# Patient Record
Sex: Female | Born: 1945
Health system: Southern US, Community
[De-identification: ages and names within clinical notes are randomized; demographics above are authoritative.]

## PROBLEM LIST (undated history)

## (undated) DIAGNOSIS — I639 Cerebral infarction, unspecified: Secondary | ICD-10-CM

## (undated) DIAGNOSIS — M069 Rheumatoid arthritis, unspecified: Secondary | ICD-10-CM

## (undated) DIAGNOSIS — I10 Essential (primary) hypertension: Secondary | ICD-10-CM

## (undated) HISTORY — DX: Essential (primary) hypertension: I10

## (undated) HISTORY — DX: Cerebral infarction, unspecified: I63.9

## (undated) HISTORY — PX: ESOPHAGOGASTRODUODENOSCOPY: SHX1529

## (undated) HISTORY — PX: COLONOSCOPY: SHX174

## (undated) HISTORY — DX: Rheumatoid arthritis, unspecified: M06.9

---

## 2002-05-09 ENCOUNTER — Emergency Department (HOSPITAL_COMMUNITY): Admission: EM | Admit: 2002-05-09 | Discharge: 2002-05-09 | Payer: Self-pay | Admitting: Emergency Medicine

## 2002-10-30 ENCOUNTER — Encounter: Admission: RE | Admit: 2002-10-30 | Discharge: 2002-10-30 | Payer: Self-pay | Admitting: Family Medicine

## 2002-10-30 ENCOUNTER — Encounter: Payer: Self-pay | Admitting: Family Medicine

## 2003-11-08 ENCOUNTER — Encounter: Admission: RE | Admit: 2003-11-08 | Discharge: 2003-11-08 | Payer: Self-pay | Admitting: Family Medicine

## 2003-11-13 ENCOUNTER — Other Ambulatory Visit: Admission: RE | Admit: 2003-11-13 | Discharge: 2003-11-13 | Payer: Self-pay | Admitting: Family Medicine

## 2004-11-10 ENCOUNTER — Encounter: Admission: RE | Admit: 2004-11-10 | Discharge: 2004-11-10 | Payer: Self-pay | Admitting: Family Medicine

## 2004-12-02 ENCOUNTER — Other Ambulatory Visit: Admission: RE | Admit: 2004-12-02 | Discharge: 2004-12-02 | Payer: Self-pay | Admitting: Family Medicine

## 2005-11-16 ENCOUNTER — Encounter: Admission: RE | Admit: 2005-11-16 | Discharge: 2005-11-16 | Payer: Self-pay | Admitting: Obstetrics and Gynecology

## 2006-11-22 ENCOUNTER — Encounter: Admission: RE | Admit: 2006-11-22 | Discharge: 2006-11-22 | Payer: Self-pay | Admitting: Family Medicine

## 2007-11-23 ENCOUNTER — Encounter: Admission: RE | Admit: 2007-11-23 | Discharge: 2007-11-23 | Payer: Self-pay | Admitting: Family Medicine

## 2007-12-06 ENCOUNTER — Other Ambulatory Visit: Admission: RE | Admit: 2007-12-06 | Discharge: 2007-12-06 | Payer: Self-pay | Admitting: Family Medicine

## 2008-06-14 ENCOUNTER — Encounter: Admission: RE | Admit: 2008-06-14 | Discharge: 2008-06-14 | Payer: Self-pay | Admitting: Gastroenterology

## 2008-06-22 ENCOUNTER — Encounter: Admission: RE | Admit: 2008-06-22 | Discharge: 2008-06-22 | Payer: Self-pay | Admitting: Gastroenterology

## 2008-11-26 ENCOUNTER — Encounter: Admission: RE | Admit: 2008-11-26 | Discharge: 2008-11-26 | Payer: Self-pay | Admitting: Family Medicine

## 2009-04-27 ENCOUNTER — Inpatient Hospital Stay (HOSPITAL_COMMUNITY): Admission: EM | Admit: 2009-04-27 | Discharge: 2009-04-30 | Payer: Self-pay | Admitting: Emergency Medicine

## 2009-04-28 ENCOUNTER — Encounter (INDEPENDENT_AMBULATORY_CARE_PROVIDER_SITE_OTHER): Payer: Self-pay | Admitting: Internal Medicine

## 2009-04-29 ENCOUNTER — Encounter (INDEPENDENT_AMBULATORY_CARE_PROVIDER_SITE_OTHER): Payer: Self-pay | Admitting: Internal Medicine

## 2009-12-03 ENCOUNTER — Encounter: Admission: RE | Admit: 2009-12-03 | Discharge: 2009-12-03 | Payer: Self-pay | Admitting: Family Medicine

## 2010-10-16 LAB — URINALYSIS, ROUTINE W REFLEX MICROSCOPIC
Bilirubin Urine: NEGATIVE
Glucose, UA: NEGATIVE mg/dL
Hgb urine dipstick: NEGATIVE
Ketones, ur: NEGATIVE mg/dL
Nitrite: NEGATIVE
Protein, ur: NEGATIVE mg/dL
Specific Gravity, Urine: 1.011 (ref 1.005–1.030)
Urobilinogen, UA: 0.2 mg/dL (ref 0.0–1.0)
pH: 5.5 (ref 5.0–8.0)

## 2010-10-16 LAB — UIFE/LIGHT CHAINS/TP QN, 24-HR UR
Albumin, U: DETECTED
Alpha 1, Urine: DETECTED — AB
Alpha 2, Urine: DETECTED — AB
Beta, Urine: DETECTED — AB
Free Kappa Lt Chains,Ur: 0.64 mg/dL (ref 0.04–1.51)
Free Kappa/Lambda Ratio: 7.11 ratio — ABNORMAL HIGH (ref 0.46–4.00)
Free Lambda Excretion/Day: 1.19 mg/d
Free Lambda Lt Chains,Ur: 0.09 mg/dL (ref 0.08–1.01)
Free Lt Chn Excr Rate: 8.48 mg/d
Gamma Globulin, Urine: DETECTED — AB
Time: 24 hours
Total Protein, Urine-Ur/day: 16 mg/d (ref 10–140)
Total Protein, Urine: 1.2 mg/dL
Volume, Urine: 1325 mL

## 2010-10-16 LAB — SEDIMENTATION RATE: Sed Rate: 17 mm/hr (ref 0–22)

## 2010-10-16 LAB — DIFFERENTIAL
Basophils Absolute: 0 10*3/uL (ref 0.0–0.1)
Basophils Relative: 0 % (ref 0–1)
Eosinophils Absolute: 0.1 10*3/uL (ref 0.0–0.7)
Eosinophils Relative: 1 % (ref 0–5)
Lymphocytes Relative: 22 % (ref 12–46)
Lymphs Abs: 2.1 10*3/uL (ref 0.7–4.0)
Monocytes Absolute: 0.5 10*3/uL (ref 0.1–1.0)
Monocytes Relative: 5 % (ref 3–12)
Neutro Abs: 6.7 10*3/uL (ref 1.7–7.7)
Neutrophils Relative %: 71 % (ref 43–77)

## 2010-10-16 LAB — CARDIAC PANEL(CRET KIN+CKTOT+MB+TROPI)
CK, MB: 1.2 ng/mL (ref 0.3–4.0)
CK, MB: 1.2 ng/mL (ref 0.3–4.0)
Relative Index: 1.2 (ref 0.0–2.5)
Relative Index: INVALID (ref 0.0–2.5)
Total CK: 104 U/L (ref 7–177)
Total CK: 98 U/L (ref 7–177)
Troponin I: 0.01 ng/mL (ref 0.00–0.06)
Troponin I: 0.02 ng/mL (ref 0.00–0.06)

## 2010-10-16 LAB — HOMOCYSTEINE
Homocysteine: 10 umol/L (ref 4.0–15.4)
Homocysteine: 8 umol/L (ref 4.0–15.4)

## 2010-10-16 LAB — COMPREHENSIVE METABOLIC PANEL
ALT: 18 U/L (ref 0–35)
ALT: 18 U/L (ref 0–35)
AST: 19 U/L (ref 0–37)
AST: 22 U/L (ref 0–37)
Albumin: 3.7 g/dL (ref 3.5–5.2)
Albumin: 3.9 g/dL (ref 3.5–5.2)
Alkaline Phosphatase: 101 U/L (ref 39–117)
Alkaline Phosphatase: 108 U/L (ref 39–117)
BUN: 14 mg/dL (ref 6–23)
BUN: 15 mg/dL (ref 6–23)
CO2: 26 mEq/L (ref 19–32)
CO2: 29 mEq/L (ref 19–32)
Calcium: 9.3 mg/dL (ref 8.4–10.5)
Calcium: 9.3 mg/dL (ref 8.4–10.5)
Chloride: 105 mEq/L (ref 96–112)
Chloride: 106 mEq/L (ref 96–112)
Creatinine, Ser: 0.86 mg/dL (ref 0.4–1.2)
Creatinine, Ser: 0.89 mg/dL (ref 0.4–1.2)
GFR calc Af Amer: >60 mL/min (ref >60)
GFR calc Af Amer: >60 mL/min (ref >60)
GFR calc non Af Amer: >60 mL/min (ref >60)
GFR calc non Af Amer: >60 mL/min (ref >60)
Glucose, Bld: 112 mg/dL — ABNORMAL HIGH (ref 70–99)
Glucose, Bld: 131 mg/dL — ABNORMAL HIGH (ref 70–99)
Potassium: 3.4 mEq/L — ABNORMAL LOW (ref 3.5–5.1)
Potassium: 3.8 mEq/L (ref 3.5–5.1)
Sodium: 140 mEq/L (ref 135–145)
Sodium: 141 mEq/L (ref 135–145)
Total Bilirubin: 0.6 mg/dL (ref 0.3–1.2)
Total Bilirubin: 0.6 mg/dL (ref 0.3–1.2)
Total Protein: 7 g/dL (ref 6.0–8.3)
Total Protein: 7.5 g/dL (ref 6.0–8.3)

## 2010-10-16 LAB — BASIC METABOLIC PANEL
BUN: 14 mg/dL (ref 6–23)
BUN: 15 mg/dL (ref 6–23)
CO2: 28 mEq/L (ref 19–32)
CO2: 28 mEq/L (ref 19–32)
Calcium: 9 mg/dL (ref 8.4–10.5)
Calcium: 9.3 mg/dL (ref 8.4–10.5)
Chloride: 102 mEq/L (ref 96–112)
Chloride: 107 mEq/L (ref 96–112)
Creatinine, Ser: 0.89 mg/dL (ref 0.4–1.2)
Creatinine, Ser: 0.92 mg/dL (ref 0.4–1.2)
GFR calc Af Amer: >60 mL/min (ref >60)
GFR calc Af Amer: >60 mL/min (ref >60)
GFR calc non Af Amer: >60 mL/min (ref >60)
GFR calc non Af Amer: >60 mL/min (ref >60)
Glucose, Bld: 111 mg/dL — ABNORMAL HIGH (ref 70–99)
Glucose, Bld: 92 mg/dL (ref 70–99)
Potassium: 3.6 mEq/L (ref 3.5–5.1)
Potassium: 3.7 mEq/L (ref 3.5–5.1)
Sodium: 136 mEq/L (ref 135–145)
Sodium: 141 mEq/L (ref 135–145)

## 2010-10-16 LAB — LIPID PANEL
Cholesterol: 170 mg/dL (ref 0–200)
HDL: 53 mg/dL (ref >39)
LDL Cholesterol: 89 mg/dL (ref 0–99)
Total CHOL/HDL Ratio: 3.2 RATIO
Triglycerides: 139 mg/dL (ref <150)
VLDL: 28 mg/dL (ref 0–40)

## 2010-10-16 LAB — URINE CULTURE
Colony Count: NO GROWTH
Culture: NO GROWTH

## 2010-10-16 LAB — CBC
HCT: 33.7 % — ABNORMAL LOW (ref 36.0–46.0)
HCT: 34.4 % — ABNORMAL LOW (ref 36.0–46.0)
HCT: 37.3 % (ref 36.0–46.0)
Hemoglobin: 11.6 g/dL — ABNORMAL LOW (ref 12.0–15.0)
Hemoglobin: 11.9 g/dL — ABNORMAL LOW (ref 12.0–15.0)
Hemoglobin: 12.6 g/dL (ref 12.0–15.0)
MCHC: 33.9 g/dL (ref 30.0–36.0)
MCHC: 34.4 g/dL (ref 30.0–36.0)
MCHC: 34.5 g/dL (ref 30.0–36.0)
MCV: 89.1 fL (ref 78.0–100.0)
MCV: 90 fL (ref 78.0–100.0)
MCV: 90 fL (ref 78.0–100.0)
Platelets: 223 10*3/uL (ref 150–400)
Platelets: 227 10*3/uL (ref 150–400)
Platelets: 272 10*3/uL (ref 150–400)
RBC: 3.74 MIL/uL — ABNORMAL LOW (ref 3.87–5.11)
RBC: 3.82 MIL/uL — ABNORMAL LOW (ref 3.87–5.11)
RBC: 4.18 MIL/uL (ref 3.87–5.11)
RDW: 13.7 % (ref 11.5–15.5)
RDW: 13.8 % (ref 11.5–15.5)
RDW: 13.8 % (ref 11.5–15.5)
WBC: 6.2 10*3/uL (ref 4.0–10.5)
WBC: 7.1 10*3/uL (ref 4.0–10.5)
WBC: 9.4 10*3/uL (ref 4.0–10.5)

## 2010-10-16 LAB — APTT: aPTT: 32 seconds (ref 24–37)

## 2010-10-16 LAB — PROTIME-INR
INR: 1.14 (ref 0.00–1.49)
Prothrombin Time: 14.5 seconds (ref 11.6–15.2)

## 2010-10-16 LAB — CK TOTAL AND CKMB (NOT AT ARMC)
CK, MB: 1.5 ng/mL (ref 0.3–4.0)
Relative Index: 1.3 (ref 0.0–2.5)
Total CK: 118 U/L (ref 7–177)

## 2010-10-16 LAB — HEMOGLOBIN A1C
Hgb A1c MFr Bld: 5.6 % (ref 4.6–6.1)
Mean Plasma Glucose: 114 mg/dL

## 2010-10-16 LAB — TSH: TSH: 1.154 u[IU]/mL (ref 0.350–4.500)

## 2010-10-16 LAB — VITAMIN B12: Vitamin B-12: 1151 pg/mL — ABNORMAL HIGH (ref 211–911)

## 2010-10-16 LAB — PROTEIN ELECTROPH W RFLX QUANT IMMUNOGLOBULINS
Albumin ELP: 54.7 % — ABNORMAL LOW (ref 55.8–66.1)
Alpha-1-Globulin: 4 % (ref 2.9–4.9)
Alpha-2-Globulin: 9.2 % (ref 7.1–11.8)
Beta 2: 6.4 % (ref 3.2–6.5)
Beta Globulin: 5.6 % (ref 4.7–7.2)
Gamma Globulin: 20.1 % — ABNORMAL HIGH (ref 11.1–18.8)
M-Spike, %: NOT DETECTED g/dL
Total Protein ELP: 7.3 g/dL (ref 6.0–8.3)

## 2010-10-16 LAB — FOLATE: Folate: >20 ng/mL

## 2010-10-16 LAB — RPR: RPR Ser Ql: NONREACTIVE

## 2010-10-16 LAB — ANA: Anti Nuclear Antibody(ANA): NEGATIVE

## 2010-10-16 LAB — TROPONIN I: Troponin I: 0.01 ng/mL (ref 0.00–0.06)

## 2010-10-30 ENCOUNTER — Other Ambulatory Visit (HOSPITAL_COMMUNITY)
Admission: RE | Admit: 2010-10-30 | Discharge: 2010-10-30 | Disposition: A | Discharge status: Home / Self Care | Payer: Federal, State, Local not specified - PPO | Source: Ambulatory Visit | Attending: Family Medicine | Admitting: Family Medicine

## 2010-10-30 ENCOUNTER — Other Ambulatory Visit: Payer: Self-pay | Admitting: Family Medicine

## 2010-10-30 DIAGNOSIS — Z124 Encounter for screening for malignant neoplasm of cervix: Secondary | ICD-10-CM | POA: Insufficient documentation

## 2010-11-20 ENCOUNTER — Other Ambulatory Visit: Payer: Self-pay | Admitting: Family Medicine

## 2010-11-20 DIAGNOSIS — Z1231 Encounter for screening mammogram for malignant neoplasm of breast: Secondary | ICD-10-CM

## 2010-12-23 ENCOUNTER — Ambulatory Visit
Admission: RE | Admit: 2010-12-23 | Discharge: 2010-12-23 | Disposition: A | Discharge status: Home / Self Care | Payer: Federal, State, Local not specified - PPO | Source: Ambulatory Visit | Attending: Family Medicine | Admitting: Family Medicine

## 2010-12-23 DIAGNOSIS — Z1231 Encounter for screening mammogram for malignant neoplasm of breast: Secondary | ICD-10-CM

## 2011-02-19 ENCOUNTER — Other Ambulatory Visit: Payer: Self-pay | Admitting: Rheumatology

## 2011-02-19 DIAGNOSIS — M069 Rheumatoid arthritis, unspecified: Secondary | ICD-10-CM

## 2011-02-26 ENCOUNTER — Ambulatory Visit
Admission: RE | Admit: 2011-02-26 | Discharge: 2011-02-26 | Disposition: A | Discharge status: Home / Self Care | Payer: Federal, State, Local not specified - PPO | Source: Ambulatory Visit | Attending: Rheumatology | Admitting: Rheumatology

## 2011-02-26 DIAGNOSIS — M069 Rheumatoid arthritis, unspecified: Secondary | ICD-10-CM

## 2011-11-03 ENCOUNTER — Other Ambulatory Visit (HOSPITAL_COMMUNITY)
Admission: RE | Admit: 2011-11-03 | Discharge: 2011-11-03 | Disposition: A | Discharge status: Home / Self Care | Payer: Federal, State, Local not specified - PPO | Source: Ambulatory Visit | Attending: Family Medicine | Admitting: Family Medicine

## 2011-11-03 ENCOUNTER — Other Ambulatory Visit: Payer: Self-pay | Admitting: Family Medicine

## 2011-11-03 DIAGNOSIS — Z124 Encounter for screening for malignant neoplasm of cervix: Secondary | ICD-10-CM | POA: Insufficient documentation

## 2011-11-23 ENCOUNTER — Other Ambulatory Visit: Payer: Self-pay | Admitting: Family Medicine

## 2011-11-23 DIAGNOSIS — Z1231 Encounter for screening mammogram for malignant neoplasm of breast: Secondary | ICD-10-CM

## 2012-02-02 ENCOUNTER — Ambulatory Visit
Admission: RE | Admit: 2012-02-02 | Discharge: 2012-02-02 | Disposition: A | Discharge status: Home / Self Care | Payer: Federal, State, Local not specified - PPO | Source: Ambulatory Visit | Attending: Family Medicine | Admitting: Family Medicine

## 2012-02-02 DIAGNOSIS — M2550 Pain in unspecified joint: Secondary | ICD-10-CM | POA: Diagnosis not present

## 2012-02-02 DIAGNOSIS — M069 Rheumatoid arthritis, unspecified: Secondary | ICD-10-CM | POA: Diagnosis not present

## 2012-02-02 DIAGNOSIS — Z1231 Encounter for screening mammogram for malignant neoplasm of breast: Secondary | ICD-10-CM | POA: Diagnosis not present

## 2012-02-02 DIAGNOSIS — Z79899 Other long term (current) drug therapy: Secondary | ICD-10-CM | POA: Diagnosis not present

## 2012-05-03 DIAGNOSIS — Z23 Encounter for immunization: Secondary | ICD-10-CM | POA: Diagnosis not present

## 2012-05-03 DIAGNOSIS — M2550 Pain in unspecified joint: Secondary | ICD-10-CM | POA: Diagnosis not present

## 2012-05-03 DIAGNOSIS — M069 Rheumatoid arthritis, unspecified: Secondary | ICD-10-CM | POA: Diagnosis not present

## 2012-05-03 DIAGNOSIS — Z79899 Other long term (current) drug therapy: Secondary | ICD-10-CM | POA: Diagnosis not present

## 2012-08-02 DIAGNOSIS — M2550 Pain in unspecified joint: Secondary | ICD-10-CM | POA: Diagnosis not present

## 2012-08-02 DIAGNOSIS — Z79899 Other long term (current) drug therapy: Secondary | ICD-10-CM | POA: Diagnosis not present

## 2012-08-02 DIAGNOSIS — M069 Rheumatoid arthritis, unspecified: Secondary | ICD-10-CM | POA: Diagnosis not present

## 2012-11-01 DIAGNOSIS — M2550 Pain in unspecified joint: Secondary | ICD-10-CM | POA: Diagnosis not present

## 2012-11-01 DIAGNOSIS — Z79899 Other long term (current) drug therapy: Secondary | ICD-10-CM | POA: Diagnosis not present

## 2012-11-01 DIAGNOSIS — M069 Rheumatoid arthritis, unspecified: Secondary | ICD-10-CM | POA: Diagnosis not present

## 2012-11-08 DIAGNOSIS — Z0000 Encounter for general adult medical examination without abnormal findings: Secondary | ICD-10-CM | POA: Diagnosis not present

## 2012-11-08 DIAGNOSIS — I679 Cerebrovascular disease, unspecified: Secondary | ICD-10-CM | POA: Diagnosis not present

## 2012-11-08 DIAGNOSIS — Z23 Encounter for immunization: Secondary | ICD-10-CM | POA: Diagnosis not present

## 2012-11-08 DIAGNOSIS — I10 Essential (primary) hypertension: Secondary | ICD-10-CM | POA: Diagnosis not present

## 2012-12-26 ENCOUNTER — Other Ambulatory Visit: Payer: Self-pay

## 2012-12-26 DIAGNOSIS — Z1231 Encounter for screening mammogram for malignant neoplasm of breast: Secondary | ICD-10-CM

## 2013-02-02 ENCOUNTER — Ambulatory Visit
Admission: RE | Admit: 2013-02-02 | Discharge: 2013-02-02 | Disposition: A | Discharge status: Home / Self Care | Payer: Medicare Other | Source: Ambulatory Visit

## 2013-02-02 DIAGNOSIS — Z1231 Encounter for screening mammogram for malignant neoplasm of breast: Secondary | ICD-10-CM | POA: Diagnosis not present

## 2013-02-07 DIAGNOSIS — M2550 Pain in unspecified joint: Secondary | ICD-10-CM | POA: Diagnosis not present

## 2013-02-07 DIAGNOSIS — M069 Rheumatoid arthritis, unspecified: Secondary | ICD-10-CM | POA: Diagnosis not present

## 2013-02-07 DIAGNOSIS — Z79899 Other long term (current) drug therapy: Secondary | ICD-10-CM | POA: Diagnosis not present

## 2013-03-02 DIAGNOSIS — J329 Chronic sinusitis, unspecified: Secondary | ICD-10-CM | POA: Diagnosis not present

## 2013-04-28 DIAGNOSIS — Z23 Encounter for immunization: Secondary | ICD-10-CM | POA: Diagnosis not present

## 2013-05-11 DIAGNOSIS — Z79899 Other long term (current) drug therapy: Secondary | ICD-10-CM | POA: Diagnosis not present

## 2013-05-11 DIAGNOSIS — M2550 Pain in unspecified joint: Secondary | ICD-10-CM | POA: Diagnosis not present

## 2013-05-11 DIAGNOSIS — R5381 Other malaise: Secondary | ICD-10-CM | POA: Diagnosis not present

## 2013-05-11 DIAGNOSIS — M069 Rheumatoid arthritis, unspecified: Secondary | ICD-10-CM | POA: Diagnosis not present

## 2013-08-10 DIAGNOSIS — M069 Rheumatoid arthritis, unspecified: Secondary | ICD-10-CM | POA: Diagnosis not present

## 2013-08-10 DIAGNOSIS — M2550 Pain in unspecified joint: Secondary | ICD-10-CM | POA: Diagnosis not present

## 2013-08-10 DIAGNOSIS — Z79899 Other long term (current) drug therapy: Secondary | ICD-10-CM | POA: Diagnosis not present

## 2013-08-10 DIAGNOSIS — R5381 Other malaise: Secondary | ICD-10-CM | POA: Diagnosis not present

## 2013-08-10 DIAGNOSIS — R5383 Other fatigue: Secondary | ICD-10-CM | POA: Diagnosis not present

## 2013-08-21 DIAGNOSIS — B009 Herpesviral infection, unspecified: Secondary | ICD-10-CM | POA: Diagnosis not present

## 2013-08-21 DIAGNOSIS — R059 Cough, unspecified: Secondary | ICD-10-CM | POA: Diagnosis not present

## 2013-08-21 DIAGNOSIS — R05 Cough: Secondary | ICD-10-CM | POA: Diagnosis not present

## 2013-09-26 DIAGNOSIS — M069 Rheumatoid arthritis, unspecified: Secondary | ICD-10-CM | POA: Diagnosis not present

## 2013-10-10 DIAGNOSIS — M069 Rheumatoid arthritis, unspecified: Secondary | ICD-10-CM | POA: Diagnosis not present

## 2013-11-07 DIAGNOSIS — M069 Rheumatoid arthritis, unspecified: Secondary | ICD-10-CM | POA: Diagnosis not present

## 2013-11-14 DIAGNOSIS — Z13820 Encounter for screening for osteoporosis: Secondary | ICD-10-CM | POA: Diagnosis not present

## 2013-11-14 DIAGNOSIS — B009 Herpesviral infection, unspecified: Secondary | ICD-10-CM | POA: Diagnosis not present

## 2013-11-14 DIAGNOSIS — Z79899 Other long term (current) drug therapy: Secondary | ICD-10-CM | POA: Diagnosis not present

## 2013-11-14 DIAGNOSIS — Z0000 Encounter for general adult medical examination without abnormal findings: Secondary | ICD-10-CM | POA: Diagnosis not present

## 2013-11-14 DIAGNOSIS — M2550 Pain in unspecified joint: Secondary | ICD-10-CM | POA: Diagnosis not present

## 2013-11-14 DIAGNOSIS — Z23 Encounter for immunization: Secondary | ICD-10-CM | POA: Diagnosis not present

## 2013-11-14 DIAGNOSIS — R5383 Other fatigue: Secondary | ICD-10-CM | POA: Diagnosis not present

## 2013-11-14 DIAGNOSIS — I10 Essential (primary) hypertension: Secondary | ICD-10-CM | POA: Diagnosis not present

## 2013-11-14 DIAGNOSIS — J309 Allergic rhinitis, unspecified: Secondary | ICD-10-CM | POA: Diagnosis not present

## 2013-11-14 DIAGNOSIS — R5381 Other malaise: Secondary | ICD-10-CM | POA: Diagnosis not present

## 2013-11-14 DIAGNOSIS — M069 Rheumatoid arthritis, unspecified: Secondary | ICD-10-CM | POA: Diagnosis not present

## 2013-11-23 DIAGNOSIS — Z86010 Personal history of colonic polyps: Secondary | ICD-10-CM | POA: Diagnosis not present

## 2013-11-23 DIAGNOSIS — Z09 Encounter for follow-up examination after completed treatment for conditions other than malignant neoplasm: Secondary | ICD-10-CM | POA: Diagnosis not present

## 2013-11-23 DIAGNOSIS — K648 Other hemorrhoids: Secondary | ICD-10-CM | POA: Diagnosis not present

## 2013-12-25 ENCOUNTER — Other Ambulatory Visit: Payer: Self-pay

## 2013-12-25 DIAGNOSIS — Z1231 Encounter for screening mammogram for malignant neoplasm of breast: Secondary | ICD-10-CM

## 2013-12-26 DIAGNOSIS — H2510 Age-related nuclear cataract, unspecified eye: Secondary | ICD-10-CM | POA: Diagnosis not present

## 2013-12-26 DIAGNOSIS — H524 Presbyopia: Secondary | ICD-10-CM | POA: Diagnosis not present

## 2013-12-26 DIAGNOSIS — H52229 Regular astigmatism, unspecified eye: Secondary | ICD-10-CM | POA: Diagnosis not present

## 2013-12-26 DIAGNOSIS — H5210 Myopia, unspecified eye: Secondary | ICD-10-CM | POA: Diagnosis not present

## 2014-01-02 DIAGNOSIS — M069 Rheumatoid arthritis, unspecified: Secondary | ICD-10-CM | POA: Diagnosis not present

## 2014-02-06 ENCOUNTER — Ambulatory Visit
Admission: RE | Admit: 2014-02-06 | Discharge: 2014-02-06 | Disposition: A | Discharge status: Home / Self Care | Payer: Medicare Other | Source: Ambulatory Visit

## 2014-02-06 DIAGNOSIS — Z1231 Encounter for screening mammogram for malignant neoplasm of breast: Secondary | ICD-10-CM | POA: Diagnosis not present

## 2014-03-01 DIAGNOSIS — M069 Rheumatoid arthritis, unspecified: Secondary | ICD-10-CM | POA: Diagnosis not present

## 2014-03-22 DIAGNOSIS — M2550 Pain in unspecified joint: Secondary | ICD-10-CM | POA: Diagnosis not present

## 2014-03-22 DIAGNOSIS — Z79899 Other long term (current) drug therapy: Secondary | ICD-10-CM | POA: Diagnosis not present

## 2014-03-22 DIAGNOSIS — M069 Rheumatoid arthritis, unspecified: Secondary | ICD-10-CM | POA: Diagnosis not present

## 2014-04-23 ENCOUNTER — Encounter: Payer: Self-pay | Admitting: *Deleted

## 2014-04-24 DIAGNOSIS — Z23 Encounter for immunization: Secondary | ICD-10-CM | POA: Diagnosis not present

## 2014-04-24 DIAGNOSIS — M0589 Other rheumatoid arthritis with rheumatoid factor of multiple sites: Secondary | ICD-10-CM | POA: Diagnosis not present

## 2014-06-19 DIAGNOSIS — M0589 Other rheumatoid arthritis with rheumatoid factor of multiple sites: Secondary | ICD-10-CM | POA: Diagnosis not present

## 2014-08-14 DIAGNOSIS — M0589 Other rheumatoid arthritis with rheumatoid factor of multiple sites: Secondary | ICD-10-CM | POA: Diagnosis not present

## 2014-08-28 DIAGNOSIS — Z79899 Other long term (current) drug therapy: Secondary | ICD-10-CM | POA: Diagnosis not present

## 2014-08-28 DIAGNOSIS — M2550 Pain in unspecified joint: Secondary | ICD-10-CM | POA: Diagnosis not present

## 2014-08-28 DIAGNOSIS — M0589 Other rheumatoid arthritis with rheumatoid factor of multiple sites: Secondary | ICD-10-CM | POA: Diagnosis not present

## 2014-10-02 DIAGNOSIS — M0589 Other rheumatoid arthritis with rheumatoid factor of multiple sites: Secondary | ICD-10-CM | POA: Diagnosis not present

## 2014-11-20 DIAGNOSIS — J309 Allergic rhinitis, unspecified: Secondary | ICD-10-CM | POA: Diagnosis not present

## 2014-11-20 DIAGNOSIS — I679 Cerebrovascular disease, unspecified: Secondary | ICD-10-CM | POA: Diagnosis not present

## 2014-11-20 DIAGNOSIS — Z0001 Encounter for general adult medical examination with abnormal findings: Secondary | ICD-10-CM | POA: Diagnosis not present

## 2014-11-20 DIAGNOSIS — K219 Gastro-esophageal reflux disease without esophagitis: Secondary | ICD-10-CM | POA: Diagnosis not present

## 2014-11-20 DIAGNOSIS — B009 Herpesviral infection, unspecified: Secondary | ICD-10-CM | POA: Diagnosis not present

## 2014-11-20 DIAGNOSIS — M0589 Other rheumatoid arthritis with rheumatoid factor of multiple sites: Secondary | ICD-10-CM | POA: Diagnosis not present

## 2014-11-20 DIAGNOSIS — I10 Essential (primary) hypertension: Secondary | ICD-10-CM | POA: Diagnosis not present

## 2014-11-27 DIAGNOSIS — M0589 Other rheumatoid arthritis with rheumatoid factor of multiple sites: Secondary | ICD-10-CM | POA: Diagnosis not present

## 2014-12-20 DIAGNOSIS — M2550 Pain in unspecified joint: Secondary | ICD-10-CM | POA: Diagnosis not present

## 2014-12-20 DIAGNOSIS — Z79899 Other long term (current) drug therapy: Secondary | ICD-10-CM | POA: Diagnosis not present

## 2014-12-20 DIAGNOSIS — M0589 Other rheumatoid arthritis with rheumatoid factor of multiple sites: Secondary | ICD-10-CM | POA: Diagnosis not present

## 2014-12-31 ENCOUNTER — Other Ambulatory Visit: Payer: Self-pay

## 2014-12-31 DIAGNOSIS — Z1231 Encounter for screening mammogram for malignant neoplasm of breast: Secondary | ICD-10-CM

## 2015-01-22 DIAGNOSIS — M0589 Other rheumatoid arthritis with rheumatoid factor of multiple sites: Secondary | ICD-10-CM | POA: Diagnosis not present

## 2015-02-08 ENCOUNTER — Ambulatory Visit
Admission: RE | Admit: 2015-02-08 | Discharge: 2015-02-08 | Disposition: A | Discharge status: Home / Self Care | Payer: Medicare Other | Source: Ambulatory Visit

## 2015-02-08 DIAGNOSIS — Z1231 Encounter for screening mammogram for malignant neoplasm of breast: Secondary | ICD-10-CM

## 2015-03-19 DIAGNOSIS — M0589 Other rheumatoid arthritis with rheumatoid factor of multiple sites: Secondary | ICD-10-CM | POA: Diagnosis not present

## 2015-03-26 DIAGNOSIS — M0589 Other rheumatoid arthritis with rheumatoid factor of multiple sites: Secondary | ICD-10-CM | POA: Diagnosis not present

## 2015-03-26 DIAGNOSIS — Z79899 Other long term (current) drug therapy: Secondary | ICD-10-CM | POA: Diagnosis not present

## 2015-03-26 DIAGNOSIS — M2550 Pain in unspecified joint: Secondary | ICD-10-CM | POA: Diagnosis not present

## 2015-04-16 DIAGNOSIS — M0589 Other rheumatoid arthritis with rheumatoid factor of multiple sites: Secondary | ICD-10-CM | POA: Diagnosis not present

## 2015-04-26 DIAGNOSIS — Z23 Encounter for immunization: Secondary | ICD-10-CM | POA: Diagnosis not present

## 2015-05-14 DIAGNOSIS — M0589 Other rheumatoid arthritis with rheumatoid factor of multiple sites: Secondary | ICD-10-CM | POA: Diagnosis not present

## 2015-07-01 DIAGNOSIS — Z79899 Other long term (current) drug therapy: Secondary | ICD-10-CM | POA: Diagnosis not present

## 2015-07-01 DIAGNOSIS — M0589 Other rheumatoid arthritis with rheumatoid factor of multiple sites: Secondary | ICD-10-CM | POA: Diagnosis not present

## 2015-07-01 DIAGNOSIS — M2550 Pain in unspecified joint: Secondary | ICD-10-CM | POA: Diagnosis not present

## 2015-07-09 DIAGNOSIS — Z79899 Other long term (current) drug therapy: Secondary | ICD-10-CM | POA: Diagnosis not present

## 2015-07-09 DIAGNOSIS — M0589 Other rheumatoid arthritis with rheumatoid factor of multiple sites: Secondary | ICD-10-CM | POA: Diagnosis not present

## 2015-09-03 DIAGNOSIS — M0589 Other rheumatoid arthritis with rheumatoid factor of multiple sites: Secondary | ICD-10-CM | POA: Diagnosis not present

## 2015-09-03 DIAGNOSIS — Z79899 Other long term (current) drug therapy: Secondary | ICD-10-CM | POA: Diagnosis not present

## 2015-09-26 DIAGNOSIS — H52223 Regular astigmatism, bilateral: Secondary | ICD-10-CM | POA: Diagnosis not present

## 2015-09-26 DIAGNOSIS — H25092 Other age-related incipient cataract, left eye: Secondary | ICD-10-CM | POA: Diagnosis not present

## 2015-09-26 DIAGNOSIS — H5213 Myopia, bilateral: Secondary | ICD-10-CM | POA: Diagnosis not present

## 2015-09-26 DIAGNOSIS — H524 Presbyopia: Secondary | ICD-10-CM | POA: Diagnosis not present

## 2015-09-27 DIAGNOSIS — M2550 Pain in unspecified joint: Secondary | ICD-10-CM | POA: Diagnosis not present

## 2015-09-27 DIAGNOSIS — M0589 Other rheumatoid arthritis with rheumatoid factor of multiple sites: Secondary | ICD-10-CM | POA: Diagnosis not present

## 2015-09-27 DIAGNOSIS — R5383 Other fatigue: Secondary | ICD-10-CM | POA: Diagnosis not present

## 2015-09-27 DIAGNOSIS — Z79899 Other long term (current) drug therapy: Secondary | ICD-10-CM | POA: Diagnosis not present

## 2015-10-29 DIAGNOSIS — M0589 Other rheumatoid arthritis with rheumatoid factor of multiple sites: Secondary | ICD-10-CM | POA: Diagnosis not present

## 2015-12-11 DIAGNOSIS — I679 Cerebrovascular disease, unspecified: Secondary | ICD-10-CM | POA: Diagnosis not present

## 2015-12-11 DIAGNOSIS — M0589 Other rheumatoid arthritis with rheumatoid factor of multiple sites: Secondary | ICD-10-CM | POA: Diagnosis not present

## 2015-12-11 DIAGNOSIS — I10 Essential (primary) hypertension: Secondary | ICD-10-CM | POA: Diagnosis not present

## 2015-12-11 DIAGNOSIS — R634 Abnormal weight loss: Secondary | ICD-10-CM | POA: Diagnosis not present

## 2015-12-11 DIAGNOSIS — Z0000 Encounter for general adult medical examination without abnormal findings: Secondary | ICD-10-CM | POA: Diagnosis not present

## 2015-12-11 DIAGNOSIS — B009 Herpesviral infection, unspecified: Secondary | ICD-10-CM | POA: Diagnosis not present

## 2015-12-11 DIAGNOSIS — K219 Gastro-esophageal reflux disease without esophagitis: Secondary | ICD-10-CM | POA: Diagnosis not present

## 2015-12-24 DIAGNOSIS — K219 Gastro-esophageal reflux disease without esophagitis: Secondary | ICD-10-CM | POA: Diagnosis not present

## 2015-12-24 DIAGNOSIS — Z0000 Encounter for general adult medical examination without abnormal findings: Secondary | ICD-10-CM | POA: Diagnosis not present

## 2015-12-24 DIAGNOSIS — I679 Cerebrovascular disease, unspecified: Secondary | ICD-10-CM | POA: Diagnosis not present

## 2015-12-24 DIAGNOSIS — Z79899 Other long term (current) drug therapy: Secondary | ICD-10-CM | POA: Diagnosis not present

## 2015-12-24 DIAGNOSIS — I10 Essential (primary) hypertension: Secondary | ICD-10-CM | POA: Diagnosis not present

## 2015-12-24 DIAGNOSIS — B009 Herpesviral infection, unspecified: Secondary | ICD-10-CM | POA: Diagnosis not present

## 2015-12-24 DIAGNOSIS — R634 Abnormal weight loss: Secondary | ICD-10-CM | POA: Diagnosis not present

## 2015-12-24 DIAGNOSIS — M0589 Other rheumatoid arthritis with rheumatoid factor of multiple sites: Secondary | ICD-10-CM | POA: Diagnosis not present

## 2015-12-27 DIAGNOSIS — Z79899 Other long term (current) drug therapy: Secondary | ICD-10-CM | POA: Diagnosis not present

## 2015-12-27 DIAGNOSIS — M2550 Pain in unspecified joint: Secondary | ICD-10-CM | POA: Diagnosis not present

## 2015-12-27 DIAGNOSIS — M0589 Other rheumatoid arthritis with rheumatoid factor of multiple sites: Secondary | ICD-10-CM | POA: Diagnosis not present

## 2016-01-21 ENCOUNTER — Other Ambulatory Visit: Payer: Self-pay | Admitting: Family Medicine

## 2016-01-21 DIAGNOSIS — Z1231 Encounter for screening mammogram for malignant neoplasm of breast: Secondary | ICD-10-CM

## 2016-02-18 DIAGNOSIS — Z79899 Other long term (current) drug therapy: Secondary | ICD-10-CM | POA: Diagnosis not present

## 2016-02-18 DIAGNOSIS — M0589 Other rheumatoid arthritis with rheumatoid factor of multiple sites: Secondary | ICD-10-CM | POA: Diagnosis not present

## 2016-02-25 ENCOUNTER — Ambulatory Visit
Admission: RE | Admit: 2016-02-25 | Discharge: 2016-02-25 | Disposition: A | Discharge status: Home / Self Care | Payer: Medicare Other | Source: Ambulatory Visit | Attending: Family Medicine | Admitting: Family Medicine

## 2016-02-25 DIAGNOSIS — Z1231 Encounter for screening mammogram for malignant neoplasm of breast: Secondary | ICD-10-CM

## 2016-03-13 DIAGNOSIS — L700 Acne vulgaris: Secondary | ICD-10-CM | POA: Diagnosis not present

## 2016-03-13 DIAGNOSIS — L508 Other urticaria: Secondary | ICD-10-CM | POA: Diagnosis not present

## 2016-04-14 DIAGNOSIS — Z79899 Other long term (current) drug therapy: Secondary | ICD-10-CM | POA: Diagnosis not present

## 2016-04-14 DIAGNOSIS — M0589 Other rheumatoid arthritis with rheumatoid factor of multiple sites: Secondary | ICD-10-CM | POA: Diagnosis not present

## 2016-04-28 DIAGNOSIS — Z79899 Other long term (current) drug therapy: Secondary | ICD-10-CM | POA: Diagnosis not present

## 2016-04-28 DIAGNOSIS — M2550 Pain in unspecified joint: Secondary | ICD-10-CM | POA: Diagnosis not present

## 2016-04-28 DIAGNOSIS — M0589 Other rheumatoid arthritis with rheumatoid factor of multiple sites: Secondary | ICD-10-CM | POA: Diagnosis not present

## 2016-05-25 DIAGNOSIS — J209 Acute bronchitis, unspecified: Secondary | ICD-10-CM | POA: Diagnosis not present

## 2016-06-09 DIAGNOSIS — Z79899 Other long term (current) drug therapy: Secondary | ICD-10-CM | POA: Diagnosis not present

## 2016-06-09 DIAGNOSIS — H578 Other specified disorders of eye and adnexa: Secondary | ICD-10-CM | POA: Diagnosis not present

## 2016-06-09 DIAGNOSIS — R51 Headache: Secondary | ICD-10-CM | POA: Diagnosis not present

## 2016-06-09 DIAGNOSIS — M0589 Other rheumatoid arthritis with rheumatoid factor of multiple sites: Secondary | ICD-10-CM | POA: Diagnosis not present

## 2016-06-11 DIAGNOSIS — G500 Trigeminal neuralgia: Secondary | ICD-10-CM | POA: Diagnosis not present

## 2016-08-04 DIAGNOSIS — M0589 Other rheumatoid arthritis with rheumatoid factor of multiple sites: Secondary | ICD-10-CM | POA: Diagnosis not present

## 2016-08-04 DIAGNOSIS — Z79899 Other long term (current) drug therapy: Secondary | ICD-10-CM | POA: Diagnosis not present

## 2016-09-29 DIAGNOSIS — M0589 Other rheumatoid arthritis with rheumatoid factor of multiple sites: Secondary | ICD-10-CM | POA: Diagnosis not present

## 2016-09-29 DIAGNOSIS — Z79899 Other long term (current) drug therapy: Secondary | ICD-10-CM | POA: Diagnosis not present

## 2016-10-27 DIAGNOSIS — Z6827 Body mass index (BMI) 27.0-27.9, adult: Secondary | ICD-10-CM | POA: Diagnosis not present

## 2016-10-27 DIAGNOSIS — Z79899 Other long term (current) drug therapy: Secondary | ICD-10-CM | POA: Diagnosis not present

## 2016-10-27 DIAGNOSIS — M2550 Pain in unspecified joint: Secondary | ICD-10-CM | POA: Diagnosis not present

## 2016-10-27 DIAGNOSIS — M0589 Other rheumatoid arthritis with rheumatoid factor of multiple sites: Secondary | ICD-10-CM | POA: Diagnosis not present

## 2016-10-27 DIAGNOSIS — E663 Overweight: Secondary | ICD-10-CM | POA: Diagnosis not present

## 2016-11-19 DIAGNOSIS — H2513 Age-related nuclear cataract, bilateral: Secondary | ICD-10-CM | POA: Diagnosis not present

## 2016-11-24 DIAGNOSIS — Z79899 Other long term (current) drug therapy: Secondary | ICD-10-CM | POA: Diagnosis not present

## 2016-11-24 DIAGNOSIS — M0589 Other rheumatoid arthritis with rheumatoid factor of multiple sites: Secondary | ICD-10-CM | POA: Diagnosis not present

## 2016-11-26 ENCOUNTER — Ambulatory Visit: Payer: Federal, State, Local not specified - PPO | Admitting: Podiatry

## 2016-12-03 ENCOUNTER — Ambulatory Visit (INDEPENDENT_AMBULATORY_CARE_PROVIDER_SITE_OTHER): Payer: Medicare Other | Admitting: Podiatry

## 2016-12-03 ENCOUNTER — Ambulatory Visit (INDEPENDENT_AMBULATORY_CARE_PROVIDER_SITE_OTHER): Payer: Medicare Other

## 2016-12-03 ENCOUNTER — Encounter: Payer: Self-pay | Admitting: Podiatry

## 2016-12-03 VITALS — BP 126/70

## 2016-12-03 DIAGNOSIS — L84 Corns and callosities: Secondary | ICD-10-CM | POA: Diagnosis not present

## 2016-12-03 DIAGNOSIS — M79671 Pain in right foot: Secondary | ICD-10-CM | POA: Diagnosis not present

## 2016-12-03 DIAGNOSIS — M79675 Pain in left toe(s): Secondary | ICD-10-CM | POA: Diagnosis not present

## 2016-12-03 DIAGNOSIS — M79674 Pain in right toe(s): Secondary | ICD-10-CM

## 2016-12-09 NOTE — Progress Notes (Signed)
Subjective:    Patient ID: Karen Delgado, female   DOB: 71 y.o.   MRN: [id number]   HPI 71 year old female presents the also concerns of a callus, not on the right third toe which is been ongoing for about 2 years. She denies any recent injury or trauma. She does try to trim the area herself. She denies he swelling or redness or any drainage. She has no other concerns today.   Review of Systems  All other systems reviewed and are negative.       Objective:  Physical Exam General: AAO x3, NAD  Dermatological: Hyperkeratotic lesion present on the right third digit DIPJ and the fourth DIPJ. Upon debridement there is no underlying ulceration, drainage or any signs of infection. No other open lesions or pre-ulcer lesions identified today.  Vascular: Dorsalis Pedis artery and Posterior Tibial artery pedal pulses are 2/4 bilateral with immedate capillary fill time. Pedal hair growth present.  There is no pain with calf compression, swelling, warmth, erythema.   Neruologic: Grossly intact via light touch bilateral. Vibratory intact via tuning fork bilateral. Protective threshold with Semmes Wienstein monofilament intact to all pedal sites bilateral.   Musculoskeletal: Minimal digital contractures present. Muscular strength 5/5 in all groups tested bilateral.  Gait: Unassisted, Nonantalgic.      Assessment:     71 year old female with hyperkeratotic lesions right foot    Plan:     -Treatment options discussed including all alternatives, risks, and complications -Etiology of symptoms were discussed -X-rays were obtained and reviewed with the patient. Adductovarus present. No evidence of acute fracture. -I discussed both conservative and surgical treatment options. Debrided hyperkeratotic lesion without complications or bleeding. We will start with conservative treatment. I dispensed offloading pads. Discussed shoe modifications. Symptoms not improve next several weeks to call the office or  sooner if any issues are to arise or if there is any recurrence.  Celesta Gentile, DPM

## 2016-12-22 DIAGNOSIS — Z0000 Encounter for general adult medical examination without abnormal findings: Secondary | ICD-10-CM | POA: Diagnosis not present

## 2016-12-22 DIAGNOSIS — I10 Essential (primary) hypertension: Secondary | ICD-10-CM | POA: Diagnosis not present

## 2016-12-22 DIAGNOSIS — K219 Gastro-esophageal reflux disease without esophagitis: Secondary | ICD-10-CM | POA: Diagnosis not present

## 2017-01-18 ENCOUNTER — Other Ambulatory Visit: Payer: Self-pay | Admitting: Family Medicine

## 2017-01-18 DIAGNOSIS — Z1231 Encounter for screening mammogram for malignant neoplasm of breast: Secondary | ICD-10-CM

## 2017-01-19 DIAGNOSIS — Z79899 Other long term (current) drug therapy: Secondary | ICD-10-CM | POA: Diagnosis not present

## 2017-01-19 DIAGNOSIS — M0589 Other rheumatoid arthritis with rheumatoid factor of multiple sites: Secondary | ICD-10-CM | POA: Diagnosis not present

## 2017-01-26 DIAGNOSIS — L508 Other urticaria: Secondary | ICD-10-CM | POA: Diagnosis not present

## 2017-02-03 DIAGNOSIS — R1032 Left lower quadrant pain: Secondary | ICD-10-CM | POA: Diagnosis not present

## 2017-03-02 ENCOUNTER — Ambulatory Visit
Admission: RE | Admit: 2017-03-02 | Discharge: 2017-03-02 | Disposition: A | Discharge status: Home / Self Care | Payer: Medicare Other | Source: Ambulatory Visit | Attending: Family Medicine | Admitting: Family Medicine

## 2017-03-02 DIAGNOSIS — Z1231 Encounter for screening mammogram for malignant neoplasm of breast: Secondary | ICD-10-CM | POA: Diagnosis not present

## 2017-03-16 DIAGNOSIS — M0589 Other rheumatoid arthritis with rheumatoid factor of multiple sites: Secondary | ICD-10-CM | POA: Diagnosis not present

## 2017-03-23 DIAGNOSIS — Z124 Encounter for screening for malignant neoplasm of cervix: Secondary | ICD-10-CM | POA: Diagnosis not present

## 2017-04-27 DIAGNOSIS — M0589 Other rheumatoid arthritis with rheumatoid factor of multiple sites: Secondary | ICD-10-CM | POA: Diagnosis not present

## 2017-04-27 DIAGNOSIS — E663 Overweight: Secondary | ICD-10-CM | POA: Diagnosis not present

## 2017-04-27 DIAGNOSIS — M2550 Pain in unspecified joint: Secondary | ICD-10-CM | POA: Diagnosis not present

## 2017-04-27 DIAGNOSIS — Z79899 Other long term (current) drug therapy: Secondary | ICD-10-CM | POA: Diagnosis not present

## 2017-04-27 DIAGNOSIS — Z6827 Body mass index (BMI) 27.0-27.9, adult: Secondary | ICD-10-CM | POA: Diagnosis not present

## 2017-04-27 DIAGNOSIS — R945 Abnormal results of liver function studies: Secondary | ICD-10-CM | POA: Diagnosis not present

## 2017-05-11 DIAGNOSIS — M0589 Other rheumatoid arthritis with rheumatoid factor of multiple sites: Secondary | ICD-10-CM | POA: Diagnosis not present

## 2017-05-11 DIAGNOSIS — Z79899 Other long term (current) drug therapy: Secondary | ICD-10-CM | POA: Diagnosis not present

## 2017-05-18 DIAGNOSIS — L609 Nail disorder, unspecified: Secondary | ICD-10-CM | POA: Diagnosis not present

## 2017-07-08 DIAGNOSIS — M0589 Other rheumatoid arthritis with rheumatoid factor of multiple sites: Secondary | ICD-10-CM | POA: Diagnosis not present

## 2017-07-08 DIAGNOSIS — Z79899 Other long term (current) drug therapy: Secondary | ICD-10-CM | POA: Diagnosis not present

## 2017-07-29 DIAGNOSIS — D485 Neoplasm of uncertain behavior of skin: Secondary | ICD-10-CM | POA: Diagnosis not present

## 2017-07-29 DIAGNOSIS — Z23 Encounter for immunization: Secondary | ICD-10-CM | POA: Diagnosis not present

## 2017-07-29 DIAGNOSIS — L603 Nail dystrophy: Secondary | ICD-10-CM | POA: Diagnosis not present

## 2017-07-29 DIAGNOSIS — B351 Tinea unguium: Secondary | ICD-10-CM | POA: Diagnosis not present

## 2017-07-29 DIAGNOSIS — L608 Other nail disorders: Secondary | ICD-10-CM | POA: Diagnosis not present

## 2017-09-02 DIAGNOSIS — Z79899 Other long term (current) drug therapy: Secondary | ICD-10-CM | POA: Diagnosis not present

## 2017-09-02 DIAGNOSIS — M0589 Other rheumatoid arthritis with rheumatoid factor of multiple sites: Secondary | ICD-10-CM | POA: Diagnosis not present

## 2017-10-26 DIAGNOSIS — R945 Abnormal results of liver function studies: Secondary | ICD-10-CM | POA: Diagnosis not present

## 2017-10-26 DIAGNOSIS — Z79899 Other long term (current) drug therapy: Secondary | ICD-10-CM | POA: Diagnosis not present

## 2017-10-26 DIAGNOSIS — M0589 Other rheumatoid arthritis with rheumatoid factor of multiple sites: Secondary | ICD-10-CM | POA: Diagnosis not present

## 2017-10-26 DIAGNOSIS — E663 Overweight: Secondary | ICD-10-CM | POA: Diagnosis not present

## 2017-10-26 DIAGNOSIS — M810 Age-related osteoporosis without current pathological fracture: Secondary | ICD-10-CM | POA: Diagnosis not present

## 2017-10-26 DIAGNOSIS — Z6827 Body mass index (BMI) 27.0-27.9, adult: Secondary | ICD-10-CM | POA: Diagnosis not present

## 2017-10-26 DIAGNOSIS — Z780 Asymptomatic menopausal state: Secondary | ICD-10-CM | POA: Diagnosis not present

## 2017-10-26 DIAGNOSIS — M2550 Pain in unspecified joint: Secondary | ICD-10-CM | POA: Diagnosis not present

## 2017-10-28 ENCOUNTER — Other Ambulatory Visit: Payer: Self-pay | Admitting: Physician Assistant

## 2017-10-28 DIAGNOSIS — M810 Age-related osteoporosis without current pathological fracture: Principal | ICD-10-CM

## 2017-10-28 DIAGNOSIS — Z79899 Other long term (current) drug therapy: Secondary | ICD-10-CM | POA: Diagnosis not present

## 2017-10-28 DIAGNOSIS — M8580 Other specified disorders of bone density and structure, unspecified site: Secondary | ICD-10-CM

## 2017-10-28 DIAGNOSIS — Z780 Asymptomatic menopausal state: Secondary | ICD-10-CM

## 2017-10-28 DIAGNOSIS — M0589 Other rheumatoid arthritis with rheumatoid factor of multiple sites: Secondary | ICD-10-CM | POA: Diagnosis not present

## 2017-12-01 ENCOUNTER — Ambulatory Visit
Admission: RE | Admit: 2017-12-01 | Discharge: 2017-12-01 | Disposition: A | Discharge status: Home / Self Care | Payer: Medicare Other | Source: Ambulatory Visit | Attending: Physician Assistant | Admitting: Physician Assistant

## 2017-12-01 DIAGNOSIS — M810 Age-related osteoporosis without current pathological fracture: Principal | ICD-10-CM

## 2017-12-01 DIAGNOSIS — Z780 Asymptomatic menopausal state: Secondary | ICD-10-CM

## 2017-12-03 ENCOUNTER — Ambulatory Visit
Admission: RE | Admit: 2017-12-03 | Discharge: 2017-12-03 | Disposition: A | Discharge status: Home / Self Care | Payer: Medicare Other | Source: Ambulatory Visit | Attending: Physician Assistant | Admitting: Physician Assistant

## 2017-12-03 DIAGNOSIS — Z780 Asymptomatic menopausal state: Secondary | ICD-10-CM | POA: Diagnosis not present

## 2017-12-03 DIAGNOSIS — M8589 Other specified disorders of bone density and structure, multiple sites: Secondary | ICD-10-CM | POA: Diagnosis not present

## 2017-12-28 DIAGNOSIS — Z79899 Other long term (current) drug therapy: Secondary | ICD-10-CM | POA: Diagnosis not present

## 2017-12-28 DIAGNOSIS — M0589 Other rheumatoid arthritis with rheumatoid factor of multiple sites: Secondary | ICD-10-CM | POA: Diagnosis not present

## 2018-01-24 ENCOUNTER — Other Ambulatory Visit: Payer: Self-pay | Admitting: Family Medicine

## 2018-01-24 DIAGNOSIS — Z1231 Encounter for screening mammogram for malignant neoplasm of breast: Secondary | ICD-10-CM

## 2018-02-22 DIAGNOSIS — Z79899 Other long term (current) drug therapy: Secondary | ICD-10-CM | POA: Diagnosis not present

## 2018-02-22 DIAGNOSIS — M0589 Other rheumatoid arthritis with rheumatoid factor of multiple sites: Secondary | ICD-10-CM | POA: Diagnosis not present

## 2018-03-01 DIAGNOSIS — E7800 Pure hypercholesterolemia, unspecified: Secondary | ICD-10-CM | POA: Diagnosis not present

## 2018-03-01 DIAGNOSIS — I679 Cerebrovascular disease, unspecified: Secondary | ICD-10-CM | POA: Diagnosis not present

## 2018-03-01 DIAGNOSIS — I10 Essential (primary) hypertension: Secondary | ICD-10-CM | POA: Diagnosis not present

## 2018-03-03 DIAGNOSIS — I679 Cerebrovascular disease, unspecified: Secondary | ICD-10-CM | POA: Diagnosis not present

## 2018-03-03 DIAGNOSIS — Z0000 Encounter for general adult medical examination without abnormal findings: Secondary | ICD-10-CM | POA: Diagnosis not present

## 2018-03-03 DIAGNOSIS — J309 Allergic rhinitis, unspecified: Secondary | ICD-10-CM | POA: Diagnosis not present

## 2018-03-03 DIAGNOSIS — K219 Gastro-esophageal reflux disease without esophagitis: Secondary | ICD-10-CM | POA: Diagnosis not present

## 2018-03-03 DIAGNOSIS — I10 Essential (primary) hypertension: Secondary | ICD-10-CM | POA: Diagnosis not present

## 2018-03-03 DIAGNOSIS — R109 Unspecified abdominal pain: Secondary | ICD-10-CM | POA: Diagnosis not present

## 2018-03-03 DIAGNOSIS — M549 Dorsalgia, unspecified: Secondary | ICD-10-CM | POA: Diagnosis not present

## 2018-03-03 DIAGNOSIS — E7800 Pure hypercholesterolemia, unspecified: Secondary | ICD-10-CM | POA: Diagnosis not present

## 2018-03-03 DIAGNOSIS — B009 Herpesviral infection, unspecified: Secondary | ICD-10-CM | POA: Diagnosis not present

## 2018-03-08 ENCOUNTER — Ambulatory Visit
Admission: RE | Admit: 2018-03-08 | Discharge: 2018-03-08 | Disposition: A | Discharge status: Home / Self Care | Payer: Medicare Other | Source: Ambulatory Visit | Attending: Family Medicine | Admitting: Family Medicine

## 2018-03-08 DIAGNOSIS — Z1231 Encounter for screening mammogram for malignant neoplasm of breast: Secondary | ICD-10-CM | POA: Diagnosis not present

## 2018-04-19 DIAGNOSIS — Z79899 Other long term (current) drug therapy: Secondary | ICD-10-CM | POA: Diagnosis not present

## 2018-04-19 DIAGNOSIS — M0589 Other rheumatoid arthritis with rheumatoid factor of multiple sites: Secondary | ICD-10-CM | POA: Diagnosis not present

## 2018-04-26 DIAGNOSIS — M2550 Pain in unspecified joint: Secondary | ICD-10-CM | POA: Diagnosis not present

## 2018-04-26 DIAGNOSIS — Z79899 Other long term (current) drug therapy: Secondary | ICD-10-CM | POA: Diagnosis not present

## 2018-04-26 DIAGNOSIS — M8580 Other specified disorders of bone density and structure, unspecified site: Secondary | ICD-10-CM | POA: Diagnosis not present

## 2018-04-26 DIAGNOSIS — Z6827 Body mass index (BMI) 27.0-27.9, adult: Secondary | ICD-10-CM | POA: Diagnosis not present

## 2018-04-26 DIAGNOSIS — M0589 Other rheumatoid arthritis with rheumatoid factor of multiple sites: Secondary | ICD-10-CM | POA: Diagnosis not present

## 2018-04-26 DIAGNOSIS — E663 Overweight: Secondary | ICD-10-CM | POA: Diagnosis not present

## 2018-06-14 DIAGNOSIS — M0589 Other rheumatoid arthritis with rheumatoid factor of multiple sites: Secondary | ICD-10-CM | POA: Diagnosis not present

## 2018-06-14 DIAGNOSIS — Z79899 Other long term (current) drug therapy: Secondary | ICD-10-CM | POA: Diagnosis not present

## 2018-07-11 DIAGNOSIS — J019 Acute sinusitis, unspecified: Secondary | ICD-10-CM | POA: Diagnosis not present

## 2018-07-11 DIAGNOSIS — R05 Cough: Secondary | ICD-10-CM | POA: Diagnosis not present

## 2018-07-11 DIAGNOSIS — J069 Acute upper respiratory infection, unspecified: Secondary | ICD-10-CM | POA: Diagnosis not present

## 2018-07-11 DIAGNOSIS — J209 Acute bronchitis, unspecified: Secondary | ICD-10-CM | POA: Diagnosis not present

## 2018-07-11 DIAGNOSIS — R509 Fever, unspecified: Secondary | ICD-10-CM | POA: Diagnosis not present

## 2018-07-28 DIAGNOSIS — K219 Gastro-esophageal reflux disease without esophagitis: Secondary | ICD-10-CM | POA: Diagnosis not present

## 2018-07-28 DIAGNOSIS — R05 Cough: Secondary | ICD-10-CM | POA: Diagnosis not present

## 2018-08-09 DIAGNOSIS — M0589 Other rheumatoid arthritis with rheumatoid factor of multiple sites: Secondary | ICD-10-CM | POA: Diagnosis not present

## 2018-08-09 DIAGNOSIS — Z79899 Other long term (current) drug therapy: Secondary | ICD-10-CM | POA: Diagnosis not present

## 2018-09-07 ENCOUNTER — Encounter: Payer: Self-pay | Admitting: Podiatry

## 2018-09-07 ENCOUNTER — Ambulatory Visit (INDEPENDENT_AMBULATORY_CARE_PROVIDER_SITE_OTHER): Payer: Medicare Other | Admitting: Podiatry

## 2018-09-07 ENCOUNTER — Ambulatory Visit (INDEPENDENT_AMBULATORY_CARE_PROVIDER_SITE_OTHER): Payer: Medicare Other

## 2018-09-07 DIAGNOSIS — M79675 Pain in left toe(s): Secondary | ICD-10-CM

## 2018-09-07 DIAGNOSIS — S90112A Contusion of left great toe without damage to nail, initial encounter: Secondary | ICD-10-CM | POA: Diagnosis not present

## 2018-09-07 DIAGNOSIS — S90222A Contusion of left lesser toe(s) with damage to nail, initial encounter: Secondary | ICD-10-CM

## 2018-09-07 MED ORDER — MELOXICAM 15 MG PO TABS: 15.0000 mg | ORAL_TABLET | Freq: Every day | ORAL | 0 refills | Status: DC

## 2018-09-07 NOTE — Progress Notes (Signed)
Subjective: 73 year old female presents the office with concerns of left great toe pain which is been ongoing for last 3 weeks.  She states that she wants to make sure she was not infected.  She states that she tried trimming her toenails about 3 weeks ago and she thinks it has been too short.  She had some pain to the tip of the toe but not around the toenail itself but more the actual tip of the nail.  She said no recent treatment for that other than applying alcohol the toenail.  She denies any numbness or tingling.   She has no other concerns. Denies any systemic complaints such as fevers, chills, nausea, vomiting. No acute changes since last appointment, and no other complaints at this time.   Objective: AAO x3, NAD DP/PT pulses palpable bilaterally, CRT less than 3 seconds There is mild incurvation to the medial lateral aspect of the left hallux toenail but there is no tenderness palpation of the nail itself there is no edema, erythema to the toenail.  There is what appears to be a bruise at the tip of the right hallux toenail and there is minimal swelling to the area.  This is where she gets her tenderness.  There is mild discomfort with palpation however she relates the pain is intermittent.  She states is more of pain to the day or nighttime. No open lesions or pre-ulcerative lesions.  No pain with calf compression, swelling, warmth, erythema  Assessment: Left hallux contusion, bruise  Plan: -All treatment options discussed with the patient including all alternatives, risks, complications.  -X-rays were obtained reviewed.  No evidence of acute fracture, foreign body. -I dispensed toe caps to avoid pressure to the toenail/toe.  We discussed offloading pads dispensed.  Offloading pads for her as well. -Prescribed mobic. Discussed side effects of the medication and directed to stop if any are to occur and call the office.  -Epson salt soaks. -Advised not cutting the toenails too  short. -Patient encouraged to call the office with any questions, concerns, change in symptoms.   Trula Slade DPM

## 2018-09-07 NOTE — Progress Notes (Signed)
   Subjective:    Patient ID: Karen Delgado, female    DOB: 11/01/1945, 73 y.o.   MRN: [id number]  HPI    Review of Systems  All other systems reviewed and are negative.      Objective:   Physical Exam        Assessment & Plan:

## 2018-09-07 NOTE — Patient Instructions (Signed)

## 2018-09-07 NOTE — Addendum Note (Signed)
Addended by: Trula Slade on: 09/07/2018 02:37 PM   Modules accepted: Orders

## 2018-09-08 ENCOUNTER — Other Ambulatory Visit: Payer: Self-pay | Admitting: Podiatry

## 2018-09-08 DIAGNOSIS — S90112A Contusion of left great toe without damage to nail, initial encounter: Secondary | ICD-10-CM

## 2018-09-18 ENCOUNTER — Other Ambulatory Visit: Payer: Self-pay | Admitting: Podiatry

## 2018-10-04 DIAGNOSIS — M0589 Other rheumatoid arthritis with rheumatoid factor of multiple sites: Secondary | ICD-10-CM | POA: Diagnosis not present

## 2018-10-04 DIAGNOSIS — Z79899 Other long term (current) drug therapy: Secondary | ICD-10-CM | POA: Diagnosis not present

## 2018-11-29 DIAGNOSIS — M0589 Other rheumatoid arthritis with rheumatoid factor of multiple sites: Secondary | ICD-10-CM | POA: Diagnosis not present

## 2018-11-29 DIAGNOSIS — Z79899 Other long term (current) drug therapy: Secondary | ICD-10-CM | POA: Diagnosis not present

## 2019-01-24 DIAGNOSIS — M0589 Other rheumatoid arthritis with rheumatoid factor of multiple sites: Secondary | ICD-10-CM | POA: Diagnosis not present

## 2019-01-24 DIAGNOSIS — Z79899 Other long term (current) drug therapy: Secondary | ICD-10-CM | POA: Diagnosis not present

## 2019-01-27 ENCOUNTER — Other Ambulatory Visit: Payer: Self-pay | Admitting: Family Medicine

## 2019-01-27 DIAGNOSIS — Z1231 Encounter for screening mammogram for malignant neoplasm of breast: Secondary | ICD-10-CM

## 2019-02-14 DIAGNOSIS — Z6827 Body mass index (BMI) 27.0-27.9, adult: Secondary | ICD-10-CM | POA: Diagnosis not present

## 2019-02-14 DIAGNOSIS — M2550 Pain in unspecified joint: Secondary | ICD-10-CM | POA: Diagnosis not present

## 2019-02-14 DIAGNOSIS — E663 Overweight: Secondary | ICD-10-CM | POA: Diagnosis not present

## 2019-02-14 DIAGNOSIS — M0589 Other rheumatoid arthritis with rheumatoid factor of multiple sites: Secondary | ICD-10-CM | POA: Diagnosis not present

## 2019-02-14 DIAGNOSIS — M8580 Other specified disorders of bone density and structure, unspecified site: Secondary | ICD-10-CM | POA: Diagnosis not present

## 2019-02-14 DIAGNOSIS — Z79899 Other long term (current) drug therapy: Secondary | ICD-10-CM | POA: Diagnosis not present

## 2019-03-02 DIAGNOSIS — K648 Other hemorrhoids: Secondary | ICD-10-CM | POA: Diagnosis not present

## 2019-03-02 DIAGNOSIS — Z86010 Personal history of colonic polyps: Secondary | ICD-10-CM | POA: Diagnosis not present

## 2019-03-15 ENCOUNTER — Other Ambulatory Visit: Payer: Self-pay

## 2019-03-15 ENCOUNTER — Ambulatory Visit
Admission: RE | Admit: 2019-03-15 | Discharge: 2019-03-15 | Disposition: A | Discharge status: Home / Self Care | Payer: Medicare Other | Source: Ambulatory Visit | Attending: Family Medicine | Admitting: Family Medicine

## 2019-03-15 DIAGNOSIS — Z1231 Encounter for screening mammogram for malignant neoplasm of breast: Secondary | ICD-10-CM | POA: Diagnosis not present

## 2019-03-21 DIAGNOSIS — M0589 Other rheumatoid arthritis with rheumatoid factor of multiple sites: Secondary | ICD-10-CM | POA: Diagnosis not present

## 2019-03-21 DIAGNOSIS — Z79899 Other long term (current) drug therapy: Secondary | ICD-10-CM | POA: Diagnosis not present

## 2019-04-01 ENCOUNTER — Other Ambulatory Visit: Payer: Self-pay

## 2019-04-01 DIAGNOSIS — Z20822 Contact with and (suspected) exposure to covid-19: Secondary | ICD-10-CM

## 2019-04-02 LAB — NOVEL CORONAVIRUS, NAA: SARS-CoV-2, NAA: NOT DETECTED

## 2019-04-04 ENCOUNTER — Telehealth: Payer: Self-pay | Admitting: *Deleted

## 2019-04-04 DIAGNOSIS — I10 Essential (primary) hypertension: Secondary | ICD-10-CM | POA: Diagnosis not present

## 2019-04-04 DIAGNOSIS — B009 Herpesviral infection, unspecified: Secondary | ICD-10-CM | POA: Diagnosis not present

## 2019-04-04 DIAGNOSIS — I679 Cerebrovascular disease, unspecified: Secondary | ICD-10-CM | POA: Diagnosis not present

## 2019-04-04 DIAGNOSIS — E7800 Pure hypercholesterolemia, unspecified: Secondary | ICD-10-CM | POA: Diagnosis not present

## 2019-04-04 DIAGNOSIS — Z23 Encounter for immunization: Secondary | ICD-10-CM | POA: Diagnosis not present

## 2019-04-04 DIAGNOSIS — Z0000 Encounter for general adult medical examination without abnormal findings: Secondary | ICD-10-CM | POA: Diagnosis not present

## 2019-04-04 DIAGNOSIS — K219 Gastro-esophageal reflux disease without esophagitis: Secondary | ICD-10-CM | POA: Diagnosis not present

## 2019-04-04 DIAGNOSIS — H938X3 Other specified disorders of ear, bilateral: Secondary | ICD-10-CM | POA: Diagnosis not present

## 2019-04-04 NOTE — Telephone Encounter (Signed)
Pt returned call for covid results; negative, verbalizes understanding. 

## 2019-04-17 DIAGNOSIS — H04123 Dry eye syndrome of bilateral lacrimal glands: Secondary | ICD-10-CM | POA: Diagnosis not present

## 2019-04-17 DIAGNOSIS — H35363 Drusen (degenerative) of macula, bilateral: Secondary | ICD-10-CM | POA: Diagnosis not present

## 2019-04-17 DIAGNOSIS — H25013 Cortical age-related cataract, bilateral: Secondary | ICD-10-CM | POA: Diagnosis not present

## 2019-04-17 DIAGNOSIS — H2513 Age-related nuclear cataract, bilateral: Secondary | ICD-10-CM | POA: Diagnosis not present

## 2019-05-16 DIAGNOSIS — Z79899 Other long term (current) drug therapy: Secondary | ICD-10-CM | POA: Diagnosis not present

## 2019-05-16 DIAGNOSIS — M0589 Other rheumatoid arthritis with rheumatoid factor of multiple sites: Secondary | ICD-10-CM | POA: Diagnosis not present

## 2019-05-29 DIAGNOSIS — M0589 Other rheumatoid arthritis with rheumatoid factor of multiple sites: Secondary | ICD-10-CM | POA: Diagnosis not present

## 2019-05-29 DIAGNOSIS — H524 Presbyopia: Secondary | ICD-10-CM | POA: Diagnosis not present

## 2019-05-29 DIAGNOSIS — H04123 Dry eye syndrome of bilateral lacrimal glands: Secondary | ICD-10-CM | POA: Diagnosis not present

## 2019-07-19 DIAGNOSIS — M542 Cervicalgia: Secondary | ICD-10-CM | POA: Diagnosis not present

## 2019-07-19 DIAGNOSIS — M5412 Radiculopathy, cervical region: Secondary | ICD-10-CM | POA: Diagnosis not present

## 2019-07-25 DIAGNOSIS — M542 Cervicalgia: Secondary | ICD-10-CM | POA: Diagnosis not present

## 2019-08-03 DIAGNOSIS — M5412 Radiculopathy, cervical region: Secondary | ICD-10-CM | POA: Diagnosis not present

## 2019-08-22 DIAGNOSIS — Z6827 Body mass index (BMI) 27.0-27.9, adult: Secondary | ICD-10-CM | POA: Diagnosis not present

## 2019-08-22 DIAGNOSIS — M2550 Pain in unspecified joint: Secondary | ICD-10-CM | POA: Diagnosis not present

## 2019-08-22 DIAGNOSIS — E663 Overweight: Secondary | ICD-10-CM | POA: Diagnosis not present

## 2019-08-22 DIAGNOSIS — Z79899 Other long term (current) drug therapy: Secondary | ICD-10-CM | POA: Diagnosis not present

## 2019-08-22 DIAGNOSIS — M0589 Other rheumatoid arthritis with rheumatoid factor of multiple sites: Secondary | ICD-10-CM | POA: Diagnosis not present

## 2019-08-22 DIAGNOSIS — M8580 Other specified disorders of bone density and structure, unspecified site: Secondary | ICD-10-CM | POA: Diagnosis not present

## 2019-08-22 DIAGNOSIS — M5412 Radiculopathy, cervical region: Secondary | ICD-10-CM | POA: Diagnosis not present

## 2019-09-12 DIAGNOSIS — Z79899 Other long term (current) drug therapy: Secondary | ICD-10-CM | POA: Diagnosis not present

## 2019-09-12 DIAGNOSIS — M0589 Other rheumatoid arthritis with rheumatoid factor of multiple sites: Secondary | ICD-10-CM | POA: Diagnosis not present

## 2019-11-07 DIAGNOSIS — M0589 Other rheumatoid arthritis with rheumatoid factor of multiple sites: Secondary | ICD-10-CM | POA: Diagnosis not present

## 2020-02-02 ENCOUNTER — Other Ambulatory Visit: Payer: Self-pay | Admitting: Family Medicine

## 2020-02-02 DIAGNOSIS — Z1231 Encounter for screening mammogram for malignant neoplasm of breast: Secondary | ICD-10-CM

## 2020-02-20 DIAGNOSIS — Z79899 Other long term (current) drug therapy: Secondary | ICD-10-CM | POA: Diagnosis not present

## 2020-02-20 DIAGNOSIS — Z6827 Body mass index (BMI) 27.0-27.9, adult: Secondary | ICD-10-CM | POA: Diagnosis not present

## 2020-02-20 DIAGNOSIS — M8580 Other specified disorders of bone density and structure, unspecified site: Secondary | ICD-10-CM | POA: Diagnosis not present

## 2020-02-20 DIAGNOSIS — M0589 Other rheumatoid arthritis with rheumatoid factor of multiple sites: Secondary | ICD-10-CM | POA: Diagnosis not present

## 2020-02-20 DIAGNOSIS — E663 Overweight: Secondary | ICD-10-CM | POA: Diagnosis not present

## 2020-02-20 DIAGNOSIS — M2550 Pain in unspecified joint: Secondary | ICD-10-CM | POA: Diagnosis not present

## 2020-02-20 DIAGNOSIS — M5412 Radiculopathy, cervical region: Secondary | ICD-10-CM | POA: Diagnosis not present

## 2020-02-23 ENCOUNTER — Other Ambulatory Visit: Payer: Self-pay | Admitting: Physician Assistant

## 2020-02-23 DIAGNOSIS — M8580 Other specified disorders of bone density and structure, unspecified site: Secondary | ICD-10-CM

## 2020-02-27 DIAGNOSIS — Z79899 Other long term (current) drug therapy: Secondary | ICD-10-CM | POA: Diagnosis not present

## 2020-02-27 DIAGNOSIS — M0589 Other rheumatoid arthritis with rheumatoid factor of multiple sites: Secondary | ICD-10-CM | POA: Diagnosis not present

## 2020-03-15 ENCOUNTER — Ambulatory Visit
Admission: RE | Admit: 2020-03-15 | Discharge: 2020-03-15 | Disposition: A | Discharge status: Home / Self Care | Payer: Medicare Other | Source: Ambulatory Visit | Attending: Family Medicine | Admitting: Family Medicine

## 2020-03-15 ENCOUNTER — Other Ambulatory Visit: Payer: Self-pay

## 2020-03-15 DIAGNOSIS — Z1231 Encounter for screening mammogram for malignant neoplasm of breast: Secondary | ICD-10-CM

## 2020-03-19 ENCOUNTER — Other Ambulatory Visit: Payer: Self-pay | Admitting: Family Medicine

## 2020-03-19 DIAGNOSIS — Z1231 Encounter for screening mammogram for malignant neoplasm of breast: Secondary | ICD-10-CM

## 2020-03-26 ENCOUNTER — Ambulatory Visit
Admission: RE | Admit: 2020-03-26 | Discharge: 2020-03-26 | Disposition: A | Discharge status: Home / Self Care | Payer: Medicare Other | Source: Ambulatory Visit | Attending: Family Medicine | Admitting: Family Medicine

## 2020-03-26 ENCOUNTER — Other Ambulatory Visit: Payer: Self-pay

## 2020-03-26 DIAGNOSIS — Z1231 Encounter for screening mammogram for malignant neoplasm of breast: Secondary | ICD-10-CM | POA: Diagnosis not present

## 2020-04-10 DIAGNOSIS — E7800 Pure hypercholesterolemia, unspecified: Secondary | ICD-10-CM | POA: Diagnosis not present

## 2020-04-10 DIAGNOSIS — I10 Essential (primary) hypertension: Secondary | ICD-10-CM | POA: Diagnosis not present

## 2020-04-10 DIAGNOSIS — M0589 Other rheumatoid arthritis with rheumatoid factor of multiple sites: Secondary | ICD-10-CM | POA: Diagnosis not present

## 2020-04-10 DIAGNOSIS — K219 Gastro-esophageal reflux disease without esophagitis: Secondary | ICD-10-CM | POA: Diagnosis not present

## 2020-04-10 DIAGNOSIS — H6121 Impacted cerumen, right ear: Secondary | ICD-10-CM | POA: Diagnosis not present

## 2020-04-10 DIAGNOSIS — Z0000 Encounter for general adult medical examination without abnormal findings: Secondary | ICD-10-CM | POA: Diagnosis not present

## 2020-04-10 DIAGNOSIS — Z23 Encounter for immunization: Secondary | ICD-10-CM | POA: Diagnosis not present

## 2020-04-23 DIAGNOSIS — M0589 Other rheumatoid arthritis with rheumatoid factor of multiple sites: Secondary | ICD-10-CM | POA: Diagnosis not present

## 2020-04-23 DIAGNOSIS — Z79899 Other long term (current) drug therapy: Secondary | ICD-10-CM | POA: Diagnosis not present

## 2020-05-07 DIAGNOSIS — Z01419 Encounter for gynecological examination (general) (routine) without abnormal findings: Secondary | ICD-10-CM | POA: Diagnosis not present

## 2020-05-07 DIAGNOSIS — Z124 Encounter for screening for malignant neoplasm of cervix: Secondary | ICD-10-CM | POA: Diagnosis not present

## 2020-05-29 DIAGNOSIS — M5416 Radiculopathy, lumbar region: Secondary | ICD-10-CM | POA: Diagnosis not present

## 2020-05-29 DIAGNOSIS — Z6825 Body mass index (BMI) 25.0-25.9, adult: Secondary | ICD-10-CM | POA: Diagnosis not present

## 2020-05-29 DIAGNOSIS — M2550 Pain in unspecified joint: Secondary | ICD-10-CM | POA: Diagnosis not present

## 2020-05-29 DIAGNOSIS — E663 Overweight: Secondary | ICD-10-CM | POA: Diagnosis not present

## 2020-05-29 DIAGNOSIS — M8580 Other specified disorders of bone density and structure, unspecified site: Secondary | ICD-10-CM | POA: Diagnosis not present

## 2020-05-29 DIAGNOSIS — M5412 Radiculopathy, cervical region: Secondary | ICD-10-CM | POA: Diagnosis not present

## 2020-05-29 DIAGNOSIS — Z79899 Other long term (current) drug therapy: Secondary | ICD-10-CM | POA: Diagnosis not present

## 2020-05-29 DIAGNOSIS — M0589 Other rheumatoid arthritis with rheumatoid factor of multiple sites: Secondary | ICD-10-CM | POA: Diagnosis not present

## 2020-05-30 DIAGNOSIS — H35033 Hypertensive retinopathy, bilateral: Secondary | ICD-10-CM | POA: Diagnosis not present

## 2020-05-30 DIAGNOSIS — H04123 Dry eye syndrome of bilateral lacrimal glands: Secondary | ICD-10-CM | POA: Diagnosis not present

## 2020-05-30 DIAGNOSIS — H35363 Drusen (degenerative) of macula, bilateral: Secondary | ICD-10-CM | POA: Diagnosis not present

## 2020-05-30 DIAGNOSIS — H2513 Age-related nuclear cataract, bilateral: Secondary | ICD-10-CM | POA: Diagnosis not present

## 2020-06-03 ENCOUNTER — Other Ambulatory Visit: Payer: Medicare Other

## 2020-06-04 ENCOUNTER — Other Ambulatory Visit: Payer: Self-pay

## 2020-06-04 ENCOUNTER — Ambulatory Visit
Admission: RE | Admit: 2020-06-04 | Discharge: 2020-06-04 | Disposition: A | Discharge status: Home / Self Care | Payer: Medicare Other | Source: Ambulatory Visit | Attending: Physician Assistant | Admitting: Physician Assistant

## 2020-06-04 DIAGNOSIS — M8589 Other specified disorders of bone density and structure, multiple sites: Secondary | ICD-10-CM | POA: Diagnosis not present

## 2020-06-04 DIAGNOSIS — M8580 Other specified disorders of bone density and structure, unspecified site: Secondary | ICD-10-CM

## 2020-06-04 DIAGNOSIS — Z780 Asymptomatic menopausal state: Secondary | ICD-10-CM | POA: Diagnosis not present

## 2020-06-18 DIAGNOSIS — M0589 Other rheumatoid arthritis with rheumatoid factor of multiple sites: Secondary | ICD-10-CM | POA: Diagnosis not present

## 2020-06-21 DIAGNOSIS — M5459 Other low back pain: Secondary | ICD-10-CM | POA: Diagnosis not present

## 2020-07-22 DIAGNOSIS — M5459 Other low back pain: Secondary | ICD-10-CM | POA: Diagnosis not present

## 2020-08-01 DIAGNOSIS — K922 Gastrointestinal hemorrhage, unspecified: Secondary | ICD-10-CM | POA: Diagnosis not present

## 2020-08-05 DIAGNOSIS — M5416 Radiculopathy, lumbar region: Secondary | ICD-10-CM | POA: Diagnosis not present

## 2020-08-06 DIAGNOSIS — K648 Other hemorrhoids: Secondary | ICD-10-CM | POA: Diagnosis not present

## 2020-08-06 DIAGNOSIS — K625 Hemorrhage of anus and rectum: Secondary | ICD-10-CM | POA: Diagnosis not present

## 2020-08-08 ENCOUNTER — Ambulatory Visit: Payer: Self-pay | Admitting: Orthopedic Surgery

## 2020-08-09 DIAGNOSIS — M5416 Radiculopathy, lumbar region: Secondary | ICD-10-CM | POA: Diagnosis not present

## 2020-08-12 ENCOUNTER — Ambulatory Visit: Payer: Self-pay | Admitting: Orthopedic Surgery

## 2020-08-12 NOTE — Progress Notes (Signed)
[id number] [place], [place] [address] [place] [place] [address] Phone: [phone] Fax: [phone]      Your procedure is scheduled on Thursday February 3rd.  Report to [facility] Main Entrance "A" at 9:00 A.M., and check in at the Admitting office.  Call this number if you have problems the morning of surgery:  [phone]  Call [phone] if you have any questions prior to your surgery date Monday-Friday 8am-4pm    Remember:  Do not eat after midnight the night before your surgery  You may drink clear liquids until 8:00am the morning of your surgery.   Clear liquids allowed are: Water, Non-Citrus Juices (without pulp), Carbonated Beverages, Clear Tea, Black Coffee Only, and Gatorade    Take these medicines the morning of surgery with A SIP OF WATER   ferrous sulfate 325 (65 FE) MG tablet  leflunomide (ARAVA) 20 MG tablet  omeprazole (PRILOSEC) 20 MG capsule    As of today, STOP taking any Aspirin (unless otherwise instructed by your surgeon) Aleve, Naproxen, Ibuprofen, Motrin, Advil, Goody's, BC's, all herbal medications, fish oil, and all vitamins.                      Do not wear jewelry, make up, or nail polish            Do not wear lotions, powders, perfumes, or deodorant.            Do not shave 48 hours prior to surgery.              Do not bring valuables to the hospital.            [facility] is not responsible for any belongings or valuables.  Do NOT Smoke (Tobacco/Vaping) or drink Alcohol 24 hours prior to your procedure If you use a CPAP at night, you may bring all equipment for your overnight stay.   Contacts, glasses, dentures or bridgework may not be worn into surgery.      For patients admitted to the hospital, discharge time will be determined by your treatment team.   Patients discharged the day of surgery will not be allowed to drive home, and someone needs  to stay with them for 24 hours.    Special instructions:   [place]- Preparing For Surgery  Before surgery, you can play an important role. Because skin is not sterile, your skin needs to be as free of germs as possible. You can reduce the number of germs on your skin by washing with CHG (chlorahexidine gluconate) Soap before surgery.  CHG is an antiseptic cleaner which kills germs and bonds with the skin to continue killing germs even after washing.    Oral Hygiene is also important to reduce your risk of infection.  Remember - BRUSH YOUR TEETH THE MORNING OF SURGERY WITH YOUR REGULAR TOOTHPASTE  Please do not use if you have an allergy to CHG or antibacterial soaps. If your skin becomes reddened/irritated stop using the CHG.  Do not shave (including legs and underarms) for at least 48 hours prior to first CHG shower. It is OK to shave your face.  Please follow these instructions carefully.   1. Shower the NIGHT BEFORE SURGERY and the MORNING OF SURGERY with CHG Soap.   2. If you chose to wash your hair, wash your hair first as usual with your normal shampoo.  3. After  you shampoo, rinse your hair and body thoroughly to remove the shampoo.  4. Use CHG as you would any other liquid soap. You can apply CHG directly to the skin and wash gently with a scrungie or a clean washcloth.   5. Apply the CHG Soap to your body ONLY FROM THE NECK DOWN.  Do not use on open wounds or open sores. Avoid contact with your eyes, ears, mouth and genitals (private parts). Wash Face and genitals (private parts)  with your normal soap.   6. Wash thoroughly, paying special attention to the area where your surgery will be performed.  7. Thoroughly rinse your body with warm water from the neck down.  8. DO NOT shower/wash with your normal soap after using and rinsing off the CHG Soap.  9. Pat yourself dry with a CLEAN TOWEL.  10. Wear CLEAN PAJAMAS to bed the night before surgery  11. Place CLEAN SHEETS  on your bed the night of your first shower and DO NOT SLEEP WITH PETS.   Day of Surgery: Wear Clean/Comfortable clothing the morning of surgery Do not apply any deodorants/lotions.   Remember to brush your teeth WITH YOUR REGULAR TOOTHPASTE.   Please read over the following fact sheets that you were given.

## 2020-08-12 NOTE — H&P (Signed)
Subjective:   Karen Delgado is a pleasant 75 year old female who was in her normal state of health until she was initially evaluated by Dr. Rolena Infante on 06/21/2020 for an acute flareup of low back Detillier left leg pain and left leg weakness. MRI identified a left large disc herniation at L4-5. Given the size of the disc herniation and the fact that the patient has neurological deficits it is unlikely that conservative care such as formalized physical therapy and/or injection therapy would give the patient any significant relief. Therefore, after discussing risks and benefits of surgical intervention the patient has elected to move forward with surgery. She is scheduled for Left L4-5 [facility]ctomy [facility] On 08/15/2020 with Dr. Rolena Infante. She presents today with her daughter for her preop H&P.  Past Medical History:  Diagnosis Date  . Hypertension   . Rheumatoid arthritis ([place])   . Stroke [facility])     Past Surgical History:  Procedure Laterality Date  . COLONOSCOPY    . ESOPHAGOGASTRODUODENOSCOPY      Current Outpatient Medications  Medication Sig Dispense Refill Last Dose  . aspirin 325 MG EC tablet Take 325 mg by mouth at bedtime.     . cholecalciferol (VITAMIN D3) 25 MCG (1000 UNIT) tablet Take 1,000 Units by mouth daily.     Marland Kitchen docusate sodium (COLACE) 100 MG capsule Take 100 mg by mouth daily.     . ferrous sulfate 325 (65 FE) MG tablet Take 325 mg by mouth daily.     . Glucosamine-Chondroitin (COSAMIN DS PO) Take 1 tablet by mouth daily.     . Golimumab (Walker ARIA IV) Inject 1 Dose into the vein every 8 (eight) weeks.     Marland Kitchen leflunomide (ARAVA) 20 MG tablet Take 20 mg by mouth daily.     Marland Kitchen losartan (COZAAR) 50 MG tablet Take 50 mg by mouth daily.     . Multiple Vitamin (MULTIVITAMIN WITH MINERALS) TABS tablet Take 1 tablet by mouth daily.     Marland Kitchen omeprazole (PRILOSEC) 20 MG capsule Take 20 mg by mouth daily.      No current facility-administered medications for this visit.   Allergies  Allergen  Reactions  . Tomato Itching and Rash    Social History   Tobacco Use  . Smoking status: Former Research scientist (life sciences)  . Smokeless tobacco: Never Used  Substance Use Topics  . Alcohol use: No    Family History  Problem Relation Age of Onset  . Heart failure Mother   . Arthritis Sister   . Breast cancer Neg Hx     Review of Systems Pertinent items are noted in HPI.  Objective:   Vitals: BP: 138/74 Pulse: 77 bpm  Clinical exam: She is a pleasant individual, who appears younger than their stated age. She is alert and orientated 3. No shortness of breath, chest pain.  Heart: Regular rate and rhythm, no rubs, murmurs, or gallops  Lungs: Clear to auscultation bilaterally  Abdomen is soft and non-tender, negative loss of bowel and bladder control, no rebound tenderness. Bowel sounds 4  Negative: skin lesions abrasions contusions Peripheral pulses: 2+ dorsalis pedis/posterior tibialis pulses bilaterally. Compartment soft and nontender.  Gait pattern: Slightly altered gait pattern due to left foot weakness. Assistive devices: None  Neuro: 3/5 EHL/tibialis anterior strength left side. Remainder of the lower extremities 5/5 motor. Positive numbness in the left L5 dermatome. Negative straight leg raise test. Negative Babinski test, no clonus, 1+ deep tendon reflexes at the knee and absent at the Achilles.  Musculoskeletal: Mild low back pain with direct palpation. No significant increased pain with range of motion. No SI joint pain with direct palpation  X-rays of the lumbar spine taken today in the office demonstrate no acute fracture. Mild degenerative changes at the L4-5 and L5-S1 disc spaces. Positive facet arthropathy is also noted at those levels.  Lumbar MRI: completed on 07/22/20 was reviewed with the patient. It was completed at [facility]; I have independently reviewed the images as well as the radiology report. Large posterior lateral to the left disc herniation at L4-5 with L5 nerve  compression. Degenerative facet disease is noted L5-S1. Mild degenerative disease at L3-4. There is mention of a left renal cortical cyst as well as 1.5 mm hepatic cyst.  Assessment:    Karen Delgado is a very pleasant 75 year old with acute onset of left radicular leg pain, L5 weakness and positive nerve root tension signs. Her clinical exam is essentially unchanged, and her MRI demonstrates a large posterior lateral to the left disc herniation with compression of the L5 nerve root. The patient's clinical exam and imaging studies confirm L5 nerve root dysfunction. Given the severity of her pain and the neurological deficits I do believe surgical intervention is her best option. Given the size of the disc herniation do not take injection therapy would be beneficial.   I have gone over the surgical procedure including the risks, benefits, and alternatives and all of her questions and concerns were addressed.Risks and benefits of decompression/discectomy: Infection, bleeding, death, stroke, paralysis, ongoing or worse pain, need for additional surgery, leak of spinal fluid, adjacent segment degeneration requiring additional surgery, post-operative hematoma formation that can result in neurological compromise and the need for urgent/emergent re-operation. Loss in bowel and bladder control. Injury to major vessels that could result in the need for urgent abdominal surgery to stop bleeding. Risk of deep venous thrombosis (DVT) and the need for additional treatment. Recurrent disc herniation resulting in the need for revision surgery, which could include fusion surgery (utilizing instrumentation such as pedicle screws and intervertebral cages). Additional risk: If instrumentation is used there is a risk of migration, or breakage of that hardware that could require additional surgery.  Plan:    Left L4-5 discectomy  We have obtained preoperative medical clearance from the patient's primary care provider.  I reviewed  the patient's medication list with her. She has stopped her aspirin as well as her 2 rheumatoid arthritis medications. She is not taking any over-the-counter anti-inflammatories. She is not on any blood thinners.  We have also discussed the post-operative recovery period to include: bathing/showering restrictions, wound healing, activity (and driving) restrictions, medications/pain mangement.  We have also discussed post-operative redflags to include: signs and symptoms of postoperative infection, DVT/PE.  Patient has been fitted for her LSO brace.  All patient's questions as well as her daughter's questions were answered at today's office visit.  Follow-up: 2 weeks postop

## 2020-08-12 NOTE — H&P (Deleted)
  The note originally documented on this encounter has been moved the the encounter in which it belongs.  

## 2020-08-13 ENCOUNTER — Other Ambulatory Visit (HOSPITAL_COMMUNITY)
Admission: RE | Admit: 2020-08-13 | Discharge: 2020-08-13 | Disposition: A | Discharge status: Home / Self Care | Payer: Medicare Other | Source: Ambulatory Visit | Attending: Orthopedic Surgery | Admitting: Orthopedic Surgery

## 2020-08-13 ENCOUNTER — Ambulatory Visit (HOSPITAL_COMMUNITY)
Admission: RE | Admit: 2020-08-13 | Discharge: 2020-08-13 | Disposition: A | Discharge status: Home / Self Care | Payer: Medicare Other | Source: Ambulatory Visit | Attending: Orthopedic Surgery | Admitting: Orthopedic Surgery

## 2020-08-13 ENCOUNTER — Encounter (HOSPITAL_COMMUNITY)
Admission: RE | Admit: 2020-08-13 | Discharge: 2020-08-13 | Disposition: A | Discharge status: Home / Self Care | Payer: Medicare Other | Source: Ambulatory Visit | Attending: Orthopedic Surgery | Admitting: Orthopedic Surgery

## 2020-08-13 ENCOUNTER — Other Ambulatory Visit: Payer: Self-pay

## 2020-08-13 ENCOUNTER — Encounter (HOSPITAL_COMMUNITY): Payer: Self-pay

## 2020-08-13 DIAGNOSIS — I10 Essential (primary) hypertension: Secondary | ICD-10-CM | POA: Insufficient documentation

## 2020-08-13 DIAGNOSIS — Z01818 Encounter for other preprocedural examination: Secondary | ICD-10-CM | POA: Insufficient documentation

## 2020-08-13 DIAGNOSIS — J449 Chronic obstructive pulmonary disease, unspecified: Secondary | ICD-10-CM | POA: Diagnosis not present

## 2020-08-13 DIAGNOSIS — Z20822 Contact with and (suspected) exposure to covid-19: Secondary | ICD-10-CM | POA: Diagnosis not present

## 2020-08-13 LAB — URINALYSIS, MICROSCOPIC (REFLEX)

## 2020-08-13 LAB — APTT: aPTT: 32 seconds (ref 24–36)

## 2020-08-13 LAB — CBC
HCT: 33.8 % — ABNORMAL LOW (ref 36.0–46.0)
Hemoglobin: 11 g/dL — ABNORMAL LOW (ref 12.0–15.0)
MCH: 28.7 pg (ref 26.0–34.0)
MCHC: 32.5 g/dL (ref 30.0–36.0)
MCV: 88.3 fL (ref 80.0–100.0)
Platelets: 245 10*3/uL (ref 150–400)
RBC: 3.83 MIL/uL — ABNORMAL LOW (ref 3.87–5.11)
RDW: 12.9 % (ref 11.5–15.5)
WBC: 6 10*3/uL (ref 4.0–10.5)
nRBC: 0 % (ref 0.0–0.2)

## 2020-08-13 LAB — URINALYSIS, ROUTINE W REFLEX MICROSCOPIC
Bilirubin Urine: NEGATIVE
Glucose, UA: NEGATIVE mg/dL
Hgb urine dipstick: NEGATIVE
Ketones, ur: NEGATIVE mg/dL
Nitrite: NEGATIVE
Protein, ur: NEGATIVE mg/dL
Specific Gravity, Urine: 1.01 (ref 1.005–1.030)
pH: 5 (ref 5.0–8.0)

## 2020-08-13 LAB — SURGICAL PCR SCREEN
MRSA, PCR: NEGATIVE
Staphylococcus aureus: NEGATIVE

## 2020-08-13 LAB — BASIC METABOLIC PANEL
Anion gap: 10 (ref 5–15)
BUN: 12 mg/dL (ref 8–23)
CO2: 28 mmol/L (ref 22–32)
Calcium: 9.3 mg/dL (ref 8.9–10.3)
Chloride: 103 mmol/L (ref 98–111)
Creatinine, Ser: 0.73 mg/dL (ref 0.44–1.00)
GFR, Estimated: >60 mL/min (ref >60)
Glucose, Bld: 86 mg/dL (ref 70–99)
Potassium: 3.6 mmol/L (ref 3.5–5.1)
Sodium: 141 mmol/L (ref 135–145)

## 2020-08-13 LAB — SARS CORONAVIRUS 2 (TAT 6-24 HRS): SARS Coronavirus 2: NEGATIVE

## 2020-08-13 LAB — PROTIME-INR
INR: 1.1 (ref 0.8–1.2)
Prothrombin Time: 13.6 seconds (ref 11.4–15.2)

## 2020-08-13 NOTE — Progress Notes (Signed)
PCP - A. ROSS      WITH [facility] Cardiologist - NA  PPhest x-ray - 08/13/20    EKG - 08/13/20   Stress Test - NA ECHO - 10/18 Cardiac Cath - NA     Blood Thinner Instructions:NA    Aspirin Instructions:STOP ERAS Protcol - INSTRUCTIONS GIVEN  COVID TEST- FOR 08/13/20   Anesthesia review:   REQUESTED PER DR Fletcher Anon  Patient denies shortness of breath, fever, cough and chest pain at PAT appointment   All instructions explained to the patient, with a verbal understanding of the material. Patient agrees to go over the instructions while at home for a better understanding. Patient also instructed to self quarantine after being tested for COVID-19. The opportunity to ask questions was provided.

## 2020-08-14 NOTE — Anesthesia Preprocedure Evaluation (Addendum)
Anesthesia Evaluation  Patient identified by MRN, date of birth, ID band Patient awake    Airway Mallampati: II  TM Distance: >3 FB Neck ROM: Full    Dental  (+) Partial Upper   Pulmonary neg pulmonary ROS, former smoker,    Pulmonary exam normal        Cardiovascular hypertension, Pt. on medications  Rhythm:Regular Rate:Normal     Neuro/Psych CVA negative psych ROS   GI/Hepatic Neg liver ROS, GERD  Medicated and Controlled,  Endo/Other  negative endocrine ROS  Renal/GU negative Renal ROS  negative genitourinary   Musculoskeletal  (+) Arthritis , Rheumatoid disorders,  L4/L5 disc herniation   Abdominal (+)  Abdomen: soft. Bowel sounds: normal.  Peds  Hematology negative hematology ROS (+)   Anesthesia Other Findings   Reproductive/Obstetrics                           Anesthesia Physical Anesthesia Plan  ASA: III  Anesthesia Plan: General   Post-op Pain Management:    Induction: Intravenous  PONV Risk Score and Plan: 3 and Ondansetron, Dexamethasone and Treatment may vary due to age or medical condition  Airway Management Planned: Mask and Oral ETT  Additional Equipment: None  Intra-op Plan:   Post-operative Plan: Extubation in OR  Informed Consent: I have reviewed the patients History and Physical, chart, labs and discussed the procedure including the risks, benefits and alternatives for the proposed anesthesia with the patient or authorized representative who has indicated his/her understanding and acceptance.     Dental advisory given  Plan Discussed with: CRNA  Anesthesia Plan Comments: (Clearance from pt PCP Dr. Lona Kettle dated 08/05/20 states hold ASA one week preop, restart 1d postop. Copy of clearance on chart.  Lab Results      Component                Value               Date                      WBC                      6.0                 08/13/2020                 HGB                      11.0 (L)            08/13/2020                HCT                      33.8 (L)            08/13/2020                MCV                      88.3                08/13/2020                PLT  245                 08/13/2020           Lab Results      Component                Value               Date                      NA                       141                 08/13/2020                K                        3.6                 08/13/2020                CO2                      28                  08/13/2020                GLUCOSE                  86                  08/13/2020                BUN                      12                  08/13/2020                CREATININE               0.73                08/13/2020                CALCIUM                  9.3                 08/13/2020                GFRNONAA                 >60                 08/13/2020                GFRAA                                        04/29/2009            >60        The eGFR has been calculated using the MDRD equation. This calculation has not been validated in all clinical situations. eGFR's persistently <60 mL/min signify possible Chronic Kidney  Disease.)       Anesthesia Quick Evaluation

## 2020-08-15 ENCOUNTER — Encounter (HOSPITAL_COMMUNITY)
Admission: RE | Disposition: A | Discharge status: Home / Self Care | Payer: Self-pay | Source: Home / Self Care | Attending: Orthopedic Surgery

## 2020-08-15 ENCOUNTER — Ambulatory Visit (HOSPITAL_COMMUNITY): Payer: Medicare Other | Admitting: Registered Nurse

## 2020-08-15 ENCOUNTER — Other Ambulatory Visit: Payer: Self-pay

## 2020-08-15 ENCOUNTER — Encounter (HOSPITAL_COMMUNITY): Payer: Self-pay | Admitting: Orthopedic Surgery

## 2020-08-15 ENCOUNTER — Ambulatory Visit (HOSPITAL_COMMUNITY): Payer: Medicare Other | Admitting: Physician Assistant

## 2020-08-15 ENCOUNTER — Ambulatory Visit (HOSPITAL_COMMUNITY)
Admission: RE | Admit: 2020-08-15 | Discharge: 2020-08-15 | Disposition: A | Discharge status: Home / Self Care | Payer: Medicare Other | Attending: Orthopedic Surgery | Admitting: Orthopedic Surgery

## 2020-08-15 ENCOUNTER — Ambulatory Visit (HOSPITAL_COMMUNITY): Payer: Medicare Other

## 2020-08-15 DIAGNOSIS — M21372 Foot drop, left foot: Secondary | ICD-10-CM | POA: Insufficient documentation

## 2020-08-15 DIAGNOSIS — Z8673 Personal history of transient ischemic attack (TIA), and cerebral infarction without residual deficits: Secondary | ICD-10-CM | POA: Insufficient documentation

## 2020-08-15 DIAGNOSIS — Z79899 Other long term (current) drug therapy: Secondary | ICD-10-CM | POA: Insufficient documentation

## 2020-08-15 DIAGNOSIS — Z981 Arthrodesis status: Secondary | ICD-10-CM | POA: Diagnosis not present

## 2020-08-15 DIAGNOSIS — Z7982 Long term (current) use of aspirin: Secondary | ICD-10-CM | POA: Insufficient documentation

## 2020-08-15 DIAGNOSIS — Z91018 Allergy to other foods: Secondary | ICD-10-CM | POA: Diagnosis not present

## 2020-08-15 DIAGNOSIS — I10 Essential (primary) hypertension: Secondary | ICD-10-CM | POA: Diagnosis not present

## 2020-08-15 DIAGNOSIS — M069 Rheumatoid arthritis, unspecified: Secondary | ICD-10-CM | POA: Diagnosis not present

## 2020-08-15 DIAGNOSIS — Z8261 Family history of arthritis: Secondary | ICD-10-CM | POA: Diagnosis not present

## 2020-08-15 DIAGNOSIS — Z87891 Personal history of nicotine dependence: Secondary | ICD-10-CM | POA: Diagnosis not present

## 2020-08-15 DIAGNOSIS — Z8249 Family history of ischemic heart disease and other diseases of the circulatory system: Secondary | ICD-10-CM | POA: Diagnosis not present

## 2020-08-15 DIAGNOSIS — M5116 Intervertebral disc disorders with radiculopathy, lumbar region: Secondary | ICD-10-CM | POA: Diagnosis not present

## 2020-08-15 DIAGNOSIS — K219 Gastro-esophageal reflux disease without esophagitis: Secondary | ICD-10-CM | POA: Diagnosis not present

## 2020-08-15 DIAGNOSIS — I639 Cerebral infarction, unspecified: Secondary | ICD-10-CM | POA: Diagnosis not present

## 2020-08-15 DIAGNOSIS — Z419 Encounter for procedure for purposes other than remedying health state, unspecified: Secondary | ICD-10-CM

## 2020-08-15 HISTORY — PX: LUMBAR LAMINECTOMY/DECOMPRESSION MICRODISCECTOMY: SHX5026

## 2020-08-15 SURGERY — LUMBAR LAMINECTOMY/DECOMPRESSION MICRODISCECTOMY 1 LEVEL
Anesthesia: General | Laterality: Left

## 2020-08-15 MED ORDER — FENTANYL CITRATE (PF) 250 MCG/5ML IJ SOLN
INTRAMUSCULAR | Status: DC | PRN
  Administered 2020-08-15: 100 ug via INTRAVENOUS

## 2020-08-15 MED ORDER — ROCURONIUM BROMIDE 10 MG/ML (PF) SYRINGE
PREFILLED_SYRINGE | INTRAVENOUS | Status: DC | PRN
  Administered 2020-08-15: 50 mg via INTRAVENOUS

## 2020-08-15 MED ORDER — METHOCARBAMOL 500 MG PO TABS: 500.0000 mg | ORAL_TABLET | Freq: Three times a day (TID) | ORAL | 0 refills | Status: AC | PRN

## 2020-08-15 MED ORDER — OXYCODONE-ACETAMINOPHEN 10-325 MG PO TABS: 1.0000 | ORAL_TABLET | Freq: Four times a day (QID) | ORAL | 0 refills | Status: AC | PRN

## 2020-08-15 MED ORDER — BUPIVACAINE HCL (PF) 0.25 % IJ SOLN
INTRAMUSCULAR | Status: DC | PRN
  Administered 2020-08-15: 10 mL

## 2020-08-15 MED ORDER — METHYLPREDNISOLONE ACETATE 40 MG/ML IJ SUSP
INTRAMUSCULAR | Status: AC
  Filled 2020-08-15: qty 1

## 2020-08-15 MED ORDER — THROMBIN 20000 UNITS EX SOLR: CUTANEOUS | Status: DC | PRN

## 2020-08-15 MED ORDER — PHENYLEPHRINE 40 MCG/ML (10ML) SYRINGE FOR IV PUSH (FOR BLOOD PRESSURE SUPPORT)
PREFILLED_SYRINGE | INTRAVENOUS | Status: DC | PRN
  Administered 2020-08-15 (×2): 80 ug via INTRAVENOUS
  Administered 2020-08-15: 40 ug via INTRAVENOUS

## 2020-08-15 MED ORDER — BUPIVACAINE HCL (PF) 0.25 % IJ SOLN
INTRAMUSCULAR | Status: AC
  Filled 2020-08-15: qty 30

## 2020-08-15 MED ORDER — CEFAZOLIN SODIUM-DEXTROSE 2-4 GM/100ML-% IV SOLN
2.0000 g | INTRAVENOUS | Status: AC
  Administered 2020-08-15: 2 g via INTRAVENOUS
  Filled 2020-08-15: qty 100

## 2020-08-15 MED ORDER — PHENYLEPHRINE HCL-NACL 10-0.9 MG/250ML-% IV SOLN
INTRAVENOUS | Status: DC | PRN
  Administered 2020-08-15: 30 ug/min via INTRAVENOUS

## 2020-08-15 MED ORDER — DEXAMETHASONE SODIUM PHOSPHATE 10 MG/ML IJ SOLN
INTRAMUSCULAR | Status: DC | PRN
  Administered 2020-08-15: 5 mg via INTRAVENOUS

## 2020-08-15 MED ORDER — FENTANYL CITRATE (PF) 250 MCG/5ML IJ SOLN
INTRAMUSCULAR | Status: AC
  Filled 2020-08-15: qty 5

## 2020-08-15 MED ORDER — CHLORHEXIDINE GLUCONATE 0.12 % MT SOLN
15.0000 mL | Freq: Once | OROMUCOSAL | Status: AC
  Administered 2020-08-15: 15 mL via OROMUCOSAL
  Filled 2020-08-15: qty 15

## 2020-08-15 MED ORDER — METHYLPREDNISOLONE ACETATE 40 MG/ML INJ SUSP (RADIOLOG
INTRAMUSCULAR | Status: DC | PRN
  Administered 2020-08-15: 20 mg

## 2020-08-15 MED ORDER — FENTANYL CITRATE (PF) 100 MCG/2ML IJ SOLN
INTRAMUSCULAR | Status: AC
  Filled 2020-08-15: qty 2

## 2020-08-15 MED ORDER — ONDANSETRON HCL 4 MG PO TABS: 4.0000 mg | ORAL_TABLET | Freq: Three times a day (TID) | ORAL | 0 refills | Status: DC | PRN

## 2020-08-15 MED ORDER — BUPIVACAINE LIPOSOME 1.3 % IJ SUSP
INTRAMUSCULAR | Status: DC | PRN
  Administered 2020-08-15: 20 mL

## 2020-08-15 MED ORDER — SUGAMMADEX SODIUM 200 MG/2ML IV SOLN
INTRAVENOUS | Status: DC | PRN
  Administered 2020-08-15 (×2): 100 mg via INTRAVENOUS

## 2020-08-15 MED ORDER — EPINEPHRINE PF 1 MG/ML IJ SOLN
INTRAMUSCULAR | Status: AC
  Filled 2020-08-15: qty 1

## 2020-08-15 MED ORDER — AMISULPRIDE (ANTIEMETIC) 5 MG/2ML IV SOLN: 10.0000 mg | Freq: Once | INTRAVENOUS | Status: DC | PRN

## 2020-08-15 MED ORDER — 0.9 % SODIUM CHLORIDE (POUR BTL) OPTIME
TOPICAL | Status: DC | PRN
  Administered 2020-08-15: 1000 mL

## 2020-08-15 MED ORDER — PROPOFOL 10 MG/ML IV BOLUS
INTRAVENOUS | Status: DC | PRN
  Administered 2020-08-15: 40 mg via INTRAVENOUS
  Administered 2020-08-15: 150 mg via INTRAVENOUS

## 2020-08-15 MED ORDER — LACTATED RINGERS IV SOLN: INTRAVENOUS | Status: DC

## 2020-08-15 MED ORDER — LIDOCAINE 2% (20 MG/ML) 5 ML SYRINGE
INTRAMUSCULAR | Status: DC | PRN
  Administered 2020-08-15: 60 mg via INTRAVENOUS

## 2020-08-15 MED ORDER — THROMBIN 20000 UNITS EX SOLR
CUTANEOUS | Status: AC
  Filled 2020-08-15: qty 20000

## 2020-08-15 MED ORDER — FENTANYL CITRATE (PF) 100 MCG/2ML IJ SOLN
25.0000 ug | INTRAMUSCULAR | Status: DC | PRN
  Administered 2020-08-15: 25 ug via INTRAVENOUS

## 2020-08-15 MED ORDER — EPINEPHRINE PF 1 MG/ML IJ SOLN
INTRAMUSCULAR | Status: DC | PRN
  Administered 2020-08-15: .15 mL

## 2020-08-15 MED ORDER — ONDANSETRON HCL 4 MG/2ML IJ SOLN
INTRAMUSCULAR | Status: DC | PRN
  Administered 2020-08-15: 4 mg via INTRAVENOUS

## 2020-08-15 MED ORDER — PROPOFOL 10 MG/ML IV BOLUS
INTRAVENOUS | Status: AC
  Filled 2020-08-15: qty 20

## 2020-08-15 MED ORDER — ORAL CARE MOUTH RINSE: 15.0000 mL | Freq: Once | OROMUCOSAL | Status: AC

## 2020-08-15 SURGICAL SUPPLY — 61 items
AGENT HMST KT MTR STRL THRMB (HEMOSTASIS) ×1
BNDG GAUZE ELAST 4 BULKY (GAUZE/BANDAGES/DRESSINGS) ×2 IMPLANT
BUR EGG ELITE 4.0 (BURR) IMPLANT
BUR MATCHSTICK NEURO 3.0 LAGG (BURR) IMPLANT
CANISTER SUCT 3000ML PPV (MISCELLANEOUS) ×2 IMPLANT
CLSR STERI-STRIP ANTIMIC 1/2X4 (GAUZE/BANDAGES/DRESSINGS) ×2 IMPLANT
CORD BIPOLAR FORCEPS 12FT (ELECTRODE) ×2 IMPLANT
COVER SURGICAL LIGHT HANDLE (MISCELLANEOUS) ×2 IMPLANT
COVER WAND RF STERILE (DRAPES) ×2 IMPLANT
DRAIN CHANNEL 15F RND FF W/TCR (WOUND CARE) IMPLANT
DRAPE POUCH INSTRU U-SHP 10X18 (DRAPES) ×2 IMPLANT
DRAPE SURG 17X23 STRL (DRAPES) ×2 IMPLANT
DRAPE U-SHAPE 47X51 STRL (DRAPES) ×2 IMPLANT
DRSG OPSITE POSTOP 3X4 (GAUZE/BANDAGES/DRESSINGS) ×2 IMPLANT
DRSG OPSITE POSTOP 4X6 (GAUZE/BANDAGES/DRESSINGS) ×1 IMPLANT
DURAPREP 26ML APPLICATOR (WOUND CARE) ×2 IMPLANT
ELECT BLADE 4.0 EZ CLEAN MEGAD (MISCELLANEOUS)
ELECT CAUTERY BLADE 6.4 (BLADE) ×2 IMPLANT
ELECT PENCIL ROCKER SW 15FT (MISCELLANEOUS) ×2 IMPLANT
ELECT REM PT RETURN 9FT ADLT (ELECTROSURGICAL) ×2
ELECTRODE BLDE 4.0 EZ CLN MEGD (MISCELLANEOUS) IMPLANT
ELECTRODE REM PT RTRN 9FT ADLT (ELECTROSURGICAL) ×1 IMPLANT
EVACUATOR SILICONE 100CC (DRAIN) IMPLANT
GLOVE BIO SURGEON STRL SZ 6.5 (GLOVE) ×2 IMPLANT
GLOVE BIOGEL PI IND STRL 8.5 (GLOVE) ×1 IMPLANT
GLOVE BIOGEL PI INDICATOR 8.5 (GLOVE) ×1
GLOVE SS BIOGEL STRL SZ 8.5 (GLOVE) ×1 IMPLANT
GLOVE SUPERSENSE BIOGEL SZ 8.5 (GLOVE) ×1
GLOVE SURG UNDER POLY LF SZ6.5 (GLOVE) ×2 IMPLANT
GOWN STRL REUS W/ TWL LRG LVL3 (GOWN DISPOSABLE) ×2 IMPLANT
GOWN STRL REUS W/TWL 2XL LVL3 (GOWN DISPOSABLE) ×2 IMPLANT
GOWN STRL REUS W/TWL LRG LVL3 (GOWN DISPOSABLE) ×4
IV CATH 14GX2 1/4 (CATHETERS) ×1 IMPLANT
KIT BASIN OR (CUSTOM PROCEDURE TRAY) ×2 IMPLANT
KIT TURNOVER KIT B (KITS) ×2 IMPLANT
NDL SPNL 18GX3.5 QUINCKE PK (NEEDLE) ×2 IMPLANT
NEEDLE 22X1 1/2 (OR ONLY) (NEEDLE) ×2 IMPLANT
NEEDLE SPNL 18GX3.5 QUINCKE PK (NEEDLE) ×4 IMPLANT
NS IRRIG 1000ML POUR BTL (IV SOLUTION) ×2 IMPLANT
PACK LAMINECTOMY ORTHO (CUSTOM PROCEDURE TRAY) ×2 IMPLANT
PACK UNIVERSAL I (CUSTOM PROCEDURE TRAY) ×2 IMPLANT
PAD ARMBOARD 7.5X6 YLW CONV (MISCELLANEOUS) ×4 IMPLANT
PATTIES SURGICAL .5 X.5 (GAUZE/BANDAGES/DRESSINGS) ×2 IMPLANT
PATTIES SURGICAL .5 X1 (DISPOSABLE) ×2 IMPLANT
SPONGE LAP 4X18 RFD (DISPOSABLE) ×1 IMPLANT
SPONGE SURGIFOAM ABS GEL 100 (HEMOSTASIS) IMPLANT
SPONGE SURGIFOAM ABS GEL SZ50 (HEMOSTASIS) ×1 IMPLANT
SURGIFLO W/THROMBIN 8M KIT (HEMOSTASIS) ×1 IMPLANT
SUT BONE WAX W31G (SUTURE) ×2 IMPLANT
SUT MNCRL AB 3-0 PS2 27 (SUTURE) ×2 IMPLANT
SUT VIC AB 0 CT1 27 (SUTURE)
SUT VIC AB 0 CT1 27XBRD ANBCTR (SUTURE) IMPLANT
SUT VIC AB 1 CT1 18XCR BRD 8 (SUTURE) ×1 IMPLANT
SUT VIC AB 1 CT1 8-18 (SUTURE) ×2
SUT VIC AB 2-0 CT1 18 (SUTURE) ×2 IMPLANT
SYR BULB IRRIG 60ML STRL (SYRINGE) ×2 IMPLANT
SYR CONTROL 10ML LL (SYRINGE) ×2 IMPLANT
TOWEL GREEN STERILE (TOWEL DISPOSABLE) ×2 IMPLANT
TOWEL GREEN STERILE FF (TOWEL DISPOSABLE) ×2 IMPLANT
WATER STERILE IRR 1000ML POUR (IV SOLUTION) ×2 IMPLANT
YANKAUER SUCT BULB TIP NO VENT (SUCTIONS) IMPLANT

## 2020-08-15 NOTE — Transfer of Care (Signed)
Immediate Anesthesia Transfer of Care Note  Patient: Karen Delgado  Procedure(s) Performed: Left L4-5 discectomy (Left )  Patient Location: PACU  Anesthesia Type:General  Level of Consciousness: awake and alert   Airway & Oxygen Therapy: Patient Spontanous Breathing and Patient connected to face mask  Post-op Assessment: Report given to RN, Post -op Vital signs reviewed and stable and Patient moving all extremities X 4  Post vital signs: Reviewed and stable  Last Vitals:  Vitals Value Taken Time  BP 104/60 08/15/20 1322  Temp    Pulse 80 08/15/20 1327  Resp 17 08/15/20 1327  SpO2 98 % 08/15/20 1327  Vitals shown include unvalidated device data.  Last Pain:  Vitals:   08/15/20 0933  TempSrc:   PainSc: 0-No pain         Complications: No complications documented.

## 2020-08-15 NOTE — Discharge Instructions (Signed)

## 2020-08-15 NOTE — H&P (Signed)
Addendum H&P  No change in clinical exam since last office visit of 08/12/2020.  Patient continues to have significant back buttock and neuropathic left leg pain.  Plan on moving forward with an L4-5 discectomy.  I reviewed the surgery, as well as the risks and benefits with the patient and she is expressed understanding as well as willingness to move forward with surgery.

## 2020-08-15 NOTE — Op Note (Signed)
Operative report  Preoperative diagnosis: L4-5 left disc herniation with L5 radiculopathy  Postoperative diagnosis: Same  Operative procedure: Left L4-5 hemilaminotomy for discectomy  CPT code: 93716  Complications: None  EBL: 25 cc  First Assistant: Cleta Alberts, PA  Indications: This is a very pleasant 75 year old woman who has severe back and radicular left leg pain.  Imaging studies confirmed the L4-5 disc herniation causing compression of the L5 nerve root.  After discussing treatment options we elected to move forward with surgery.  All appropriate risks, benefits, alternatives were discussed with the patient and consent was obtained.  Operative report  Patient was brought to the operating room placed upon the operating room table.  After successful induction of general anesthesia and endotracheal intubation: Teds, SCDs were applied and she was turned prone onto the Wilson frame.  All bony prominences were well-padded and the back was prepped and draped in a standard fashion.  Timeout was taken to confirm patient procedure and all other important data.  2 needles were placed in the back and x-ray was taken to localize the skin incision.  The incision site was infiltrated with quarter percent Marcaine with epinephrine and a midline incision was made.  Sharp dissection was carried down to the deep fascia.  I incised the deep fascia and stripped the paraspinal muscles to expose the L4 and L5 spinous processes lamina and L4-5 facet complex.  The paraspinal muscles were then injected with quarter percent Marcaine with epinephrine and Exparel for analgesia postoperatively.  Penfield 4 was placed into the L4 lamina and a second x-ray was taken confirming that I was at the L4-5 level.  Laminotomy was performed with a 3 mm Kerrison rongeur, and then I used a Penfield 4 to gently dissect through the ligamentum flavum.  Once I was through the ligamentum flavum I developed a plane between the thecal  sac and the ligamentum flavum and used my 2 and 3 mm Kerrison rongeur to remove the ligamentum flavum and expose the posterior lateral aspect of the thecal sac.  I gently dissected into the lateral recess mobilizing the nerve root and thecal sac.  I was able to palpate and ultimately visualized the disc herniation inferior to the disc space consistent with preoperative MRI.  Medial facetectomy was performed for better visualization and neuro patties were placed for retracting purposes.  Large epidural veins were identified coagulated with bipolar electrocautery.  An annulotomy was performed and I used a nerve hook to develop multiple large fragments of disc material from the inferior lateral disc herniation site.  There is also some fragments of bone/osteophyte resected.  Identified the L4-5 disc space and used my micropituitary rongeurs to remove any loose fragments of disc material that was still within the disc space itself.  At this point time I had removed all of the disc herniation and the amount of material removed was consistent with what was seen on the preoperative MRI.  The L5 nerve root was now freely mobile I could easily pass my [facility] along the path of the L5 nerve root into the foramen I could pass it superiorly in the lateral recess up to the L4 foramen and I could pass it medially along the annulus and inferior along the posterior aspect of the L5 vertebral body confirming I had adequately removed all of the disc fragment.  I irrigated the wound copiously with normal saline and then obtained hemostasis using bipolar cautery and FloSeal.  Once I had my final irrigation complete  I then checked 1 last time with my nerve hook to ensure adequate decompression and no loose fragments of material.  I then placed 20 mg of Depo-Medrol over the nerve for aided postoperative analgesia and then a thrombin-soaked Gelfoam patty over the laminotomy site.  Retractors were removed and I closed the deep  fascia in interrupted #1 Vicryl suture, superficial 2-0 Vicryl suture, and 3-0 Monocryl for the skin.  Steri-Strips and dry dressings were applied the patient was ultimately extubated transfer the PACU without incident.

## 2020-08-15 NOTE — Brief Op Note (Signed)
08/15/2020  1:10 PM  PATIENT:  Brei Pociask  75 y.o. female  PRE-OPERATIVE DIAGNOSIS:  Left L4-5 disc herniation with left foot drop  POST-OPERATIVE DIAGNOSIS:  Left L4-5 disc herniation with left foot drop  PROCEDURE:  Procedure(s) with comments: Left L4-5 discectomy (Left) - 2 hrs  SURGEON:  Surgeon(s) and Role:    Melina Schools, MD - Primary  PHYSICIAN ASSISTANT: Cleta Alberts, PA  ASSISTANTS: Cleta Alberts, PA   ANESTHESIA:   general  EBL:  25   BLOOD ADMINISTERED:none  DRAINS: none   LOCAL MEDICATIONS USED:  MARCAINE    and OTHER exparel  SPECIMEN:  No Specimen  DISPOSITION OF SPECIMEN:  N/A  COUNTS:  YES  TOURNIQUET:  * No tourniquets in log *  DICTATION: .Dragon Dictation  PLAN OF CARE: Discharge to home after PACU  PATIENT DISPOSITION:  PACU - hemodynamically stable.

## 2020-08-15 NOTE — Anesthesia Procedure Notes (Addendum)
Procedure Name: Intubation Date/Time: 08/15/2020 11:12 AM Performed by: Trinna Post., CRNA Pre-anesthesia Checklist: Patient identified, Emergency Drugs available, Suction available and Patient being monitored Patient Re-evaluated:Patient Re-evaluated prior to induction Oxygen Delivery Method: Circle system utilized Preoxygenation: Pre-oxygenation with 100% oxygen Induction Type: IV induction Ventilation: Mask ventilation without difficulty Laryngoscope Size: Mac and 3 Grade View: Grade I Tube type: Oral Tube size: 7.0 mm Number of attempts: 1 Airway Equipment and Method: Stylet and Oral airway Placement Confirmation: ETT inserted through vocal cords under direct vision,  positive ETCO2 and breath sounds checked- equal and bilateral Secured at: 22 cm Tube secured with: Tape Dental Injury: Teeth and Oropharynx as per pre-operative assessment  Comments: Intubated by Paulina Fusi, SRNA

## 2020-08-15 NOTE — OR Nursing (Signed)
Pt is awake,alert and oriented. Pt is in NAD at this time. Pt and family verbalized understanding of poc and discharge instructions. instructions given to family and reviewed prior to discharge.   

## 2020-08-16 ENCOUNTER — Encounter (HOSPITAL_COMMUNITY): Payer: Self-pay | Admitting: Orthopedic Surgery

## 2020-08-22 DIAGNOSIS — K625 Hemorrhage of anus and rectum: Secondary | ICD-10-CM | POA: Diagnosis not present

## 2020-08-27 NOTE — Anesthesia Postprocedure Evaluation (Signed)
Anesthesia Post Note  Patient: Karen Delgado  Procedure(s) Performed: Left L4-5 discectomy (Left )     Patient location during evaluation: PACU Anesthesia Type: General Level of consciousness: awake and alert Pain management: pain level controlled Vital Signs Assessment: post-procedure vital signs reviewed and stable Respiratory status: spontaneous breathing, nonlabored ventilation, respiratory function stable and patient connected to nasal cannula oxygen Cardiovascular status: blood pressure returned to baseline and stable Postop Assessment: no apparent nausea or vomiting Anesthetic complications: no   No complications documented.  Last Vitals:  Vitals:   08/15/20 1345 08/15/20 1400  BP: 104/68 110/62  Pulse: 64 (!) 56  Resp: 18 15  Temp:    SpO2: 99% 98%    Last Pain:  Vitals:   08/15/20 1400  TempSrc:   PainSc: Asleep                 Belenda Cruise P Stoltzfus

## 2020-09-26 DIAGNOSIS — M5416 Radiculopathy, lumbar region: Secondary | ICD-10-CM | POA: Diagnosis not present

## 2020-10-02 DIAGNOSIS — M5416 Radiculopathy, lumbar region: Secondary | ICD-10-CM | POA: Diagnosis not present

## 2020-10-09 DIAGNOSIS — D649 Anemia, unspecified: Secondary | ICD-10-CM | POA: Diagnosis not present

## 2020-10-09 DIAGNOSIS — R9389 Abnormal findings on diagnostic imaging of other specified body structures: Secondary | ICD-10-CM | POA: Diagnosis not present

## 2020-10-10 ENCOUNTER — Other Ambulatory Visit: Payer: Self-pay | Admitting: Family Medicine

## 2020-10-10 DIAGNOSIS — R9389 Abnormal findings on diagnostic imaging of other specified body structures: Secondary | ICD-10-CM

## 2020-10-11 DIAGNOSIS — M5416 Radiculopathy, lumbar region: Secondary | ICD-10-CM | POA: Diagnosis not present

## 2020-10-14 DIAGNOSIS — M0589 Other rheumatoid arthritis with rheumatoid factor of multiple sites: Secondary | ICD-10-CM | POA: Diagnosis not present

## 2020-10-14 DIAGNOSIS — R5383 Other fatigue: Secondary | ICD-10-CM | POA: Diagnosis not present

## 2020-10-14 DIAGNOSIS — Z79899 Other long term (current) drug therapy: Secondary | ICD-10-CM | POA: Diagnosis not present

## 2020-10-14 DIAGNOSIS — Z111 Encounter for screening for respiratory tuberculosis: Secondary | ICD-10-CM | POA: Diagnosis not present

## 2020-10-17 DIAGNOSIS — M5416 Radiculopathy, lumbar region: Secondary | ICD-10-CM | POA: Diagnosis not present

## 2020-10-21 DIAGNOSIS — M5416 Radiculopathy, lumbar region: Secondary | ICD-10-CM | POA: Diagnosis not present

## 2020-10-30 ENCOUNTER — Ambulatory Visit
Admission: RE | Admit: 2020-10-30 | Discharge: 2020-10-30 | Disposition: A | Discharge status: Home / Self Care | Payer: Medicare Other | Source: Ambulatory Visit | Attending: Family Medicine | Admitting: Family Medicine

## 2020-10-30 DIAGNOSIS — N200 Calculus of kidney: Secondary | ICD-10-CM | POA: Diagnosis not present

## 2020-10-30 DIAGNOSIS — R9389 Abnormal findings on diagnostic imaging of other specified body structures: Secondary | ICD-10-CM

## 2020-10-30 DIAGNOSIS — K7689 Other specified diseases of liver: Secondary | ICD-10-CM | POA: Diagnosis not present

## 2020-10-30 DIAGNOSIS — N281 Cyst of kidney, acquired: Secondary | ICD-10-CM | POA: Diagnosis not present

## 2020-10-30 DIAGNOSIS — R160 Hepatomegaly, not elsewhere classified: Secondary | ICD-10-CM | POA: Diagnosis not present

## 2020-11-04 DIAGNOSIS — M5416 Radiculopathy, lumbar region: Secondary | ICD-10-CM | POA: Diagnosis not present

## 2020-11-05 DIAGNOSIS — M0589 Other rheumatoid arthritis with rheumatoid factor of multiple sites: Secondary | ICD-10-CM | POA: Diagnosis not present

## 2020-11-05 DIAGNOSIS — K760 Fatty (change of) liver, not elsewhere classified: Secondary | ICD-10-CM | POA: Diagnosis not present

## 2020-11-05 DIAGNOSIS — M2550 Pain in unspecified joint: Secondary | ICD-10-CM | POA: Diagnosis not present

## 2020-11-05 DIAGNOSIS — Z79899 Other long term (current) drug therapy: Secondary | ICD-10-CM | POA: Diagnosis not present

## 2020-11-06 ENCOUNTER — Other Ambulatory Visit: Payer: Self-pay | Admitting: Family Medicine

## 2020-11-06 DIAGNOSIS — R935 Abnormal findings on diagnostic imaging of other abdominal regions, including retroperitoneum: Secondary | ICD-10-CM

## 2020-11-18 ENCOUNTER — Other Ambulatory Visit: Payer: Medicare Other

## 2020-11-19 ENCOUNTER — Ambulatory Visit
Admission: RE | Admit: 2020-11-19 | Discharge: 2020-11-19 | Disposition: A | Discharge status: Home / Self Care | Payer: Medicare Other | Source: Ambulatory Visit | Attending: Family Medicine | Admitting: Family Medicine

## 2020-11-19 ENCOUNTER — Other Ambulatory Visit: Payer: Self-pay

## 2020-11-19 DIAGNOSIS — N2889 Other specified disorders of kidney and ureter: Secondary | ICD-10-CM | POA: Diagnosis not present

## 2020-11-19 DIAGNOSIS — R935 Abnormal findings on diagnostic imaging of other abdominal regions, including retroperitoneum: Secondary | ICD-10-CM

## 2020-11-19 DIAGNOSIS — K7689 Other specified diseases of liver: Secondary | ICD-10-CM | POA: Diagnosis not present

## 2020-11-19 MED ORDER — GADOBENATE DIMEGLUMINE 529 MG/ML IV SOLN
15.0000 mL | Freq: Once | INTRAVENOUS | Status: AC | PRN
  Administered 2020-11-19: 15 mL via INTRAVENOUS

## 2020-12-10 DIAGNOSIS — M0589 Other rheumatoid arthritis with rheumatoid factor of multiple sites: Secondary | ICD-10-CM | POA: Diagnosis not present

## 2021-02-04 DIAGNOSIS — M0589 Other rheumatoid arthritis with rheumatoid factor of multiple sites: Secondary | ICD-10-CM | POA: Diagnosis not present

## 2021-02-04 DIAGNOSIS — Z79899 Other long term (current) drug therapy: Secondary | ICD-10-CM | POA: Diagnosis not present

## 2021-02-14 ENCOUNTER — Other Ambulatory Visit: Payer: Self-pay | Admitting: Family Medicine

## 2021-02-14 DIAGNOSIS — Z1231 Encounter for screening mammogram for malignant neoplasm of breast: Secondary | ICD-10-CM

## 2021-02-17 DIAGNOSIS — Z4889 Encounter for other specified surgical aftercare: Secondary | ICD-10-CM | POA: Diagnosis not present

## 2021-02-27 DIAGNOSIS — M6281 Muscle weakness (generalized): Secondary | ICD-10-CM | POA: Diagnosis not present

## 2021-03-03 DIAGNOSIS — M6281 Muscle weakness (generalized): Secondary | ICD-10-CM | POA: Diagnosis not present

## 2021-03-10 DIAGNOSIS — M6281 Muscle weakness (generalized): Secondary | ICD-10-CM | POA: Diagnosis not present

## 2021-03-14 DIAGNOSIS — M6281 Muscle weakness (generalized): Secondary | ICD-10-CM | POA: Diagnosis not present

## 2021-03-24 DIAGNOSIS — M6281 Muscle weakness (generalized): Secondary | ICD-10-CM | POA: Diagnosis not present

## 2021-04-01 DIAGNOSIS — Z79899 Other long term (current) drug therapy: Secondary | ICD-10-CM | POA: Diagnosis not present

## 2021-04-01 DIAGNOSIS — M0589 Other rheumatoid arthritis with rheumatoid factor of multiple sites: Secondary | ICD-10-CM | POA: Diagnosis not present

## 2021-04-02 DIAGNOSIS — M6281 Muscle weakness (generalized): Secondary | ICD-10-CM | POA: Diagnosis not present

## 2021-04-04 DIAGNOSIS — M6281 Muscle weakness (generalized): Secondary | ICD-10-CM | POA: Diagnosis not present

## 2021-04-07 DIAGNOSIS — M6281 Muscle weakness (generalized): Secondary | ICD-10-CM | POA: Diagnosis not present

## 2021-04-08 DIAGNOSIS — E7800 Pure hypercholesterolemia, unspecified: Secondary | ICD-10-CM | POA: Diagnosis not present

## 2021-04-08 DIAGNOSIS — I10 Essential (primary) hypertension: Secondary | ICD-10-CM | POA: Diagnosis not present

## 2021-04-08 DIAGNOSIS — Z23 Encounter for immunization: Secondary | ICD-10-CM | POA: Diagnosis not present

## 2021-04-11 ENCOUNTER — Other Ambulatory Visit: Payer: Self-pay

## 2021-04-11 ENCOUNTER — Ambulatory Visit
Admission: RE | Admit: 2021-04-11 | Discharge: 2021-04-11 | Disposition: A | Discharge status: Home / Self Care | Payer: Medicare Other | Source: Ambulatory Visit | Attending: Family Medicine | Admitting: Family Medicine

## 2021-04-11 DIAGNOSIS — Z1231 Encounter for screening mammogram for malignant neoplasm of breast: Secondary | ICD-10-CM | POA: Diagnosis not present

## 2021-04-14 DIAGNOSIS — M6281 Muscle weakness (generalized): Secondary | ICD-10-CM | POA: Diagnosis not present

## 2021-04-16 DIAGNOSIS — M6281 Muscle weakness (generalized): Secondary | ICD-10-CM | POA: Diagnosis not present

## 2021-04-17 DIAGNOSIS — E7800 Pure hypercholesterolemia, unspecified: Secondary | ICD-10-CM | POA: Diagnosis not present

## 2021-04-17 DIAGNOSIS — K219 Gastro-esophageal reflux disease without esophagitis: Secondary | ICD-10-CM | POA: Diagnosis not present

## 2021-04-17 DIAGNOSIS — Z6826 Body mass index (BMI) 26.0-26.9, adult: Secondary | ICD-10-CM | POA: Diagnosis not present

## 2021-04-17 DIAGNOSIS — Z0000 Encounter for general adult medical examination without abnormal findings: Secondary | ICD-10-CM | POA: Diagnosis not present

## 2021-04-17 DIAGNOSIS — B009 Herpesviral infection, unspecified: Secondary | ICD-10-CM | POA: Diagnosis not present

## 2021-04-17 DIAGNOSIS — I10 Essential (primary) hypertension: Secondary | ICD-10-CM | POA: Diagnosis not present

## 2021-05-07 DIAGNOSIS — Z6826 Body mass index (BMI) 26.0-26.9, adult: Secondary | ICD-10-CM | POA: Diagnosis not present

## 2021-05-07 DIAGNOSIS — M2550 Pain in unspecified joint: Secondary | ICD-10-CM | POA: Diagnosis not present

## 2021-05-07 DIAGNOSIS — Z79899 Other long term (current) drug therapy: Secondary | ICD-10-CM | POA: Diagnosis not present

## 2021-05-07 DIAGNOSIS — K760 Fatty (change of) liver, not elsewhere classified: Secondary | ICD-10-CM | POA: Diagnosis not present

## 2021-05-07 DIAGNOSIS — E663 Overweight: Secondary | ICD-10-CM | POA: Diagnosis not present

## 2021-05-07 DIAGNOSIS — M0589 Other rheumatoid arthritis with rheumatoid factor of multiple sites: Secondary | ICD-10-CM | POA: Diagnosis not present

## 2021-05-27 DIAGNOSIS — M0589 Other rheumatoid arthritis with rheumatoid factor of multiple sites: Secondary | ICD-10-CM | POA: Diagnosis not present

## 2021-06-02 DIAGNOSIS — H35033 Hypertensive retinopathy, bilateral: Secondary | ICD-10-CM | POA: Diagnosis not present

## 2021-06-02 DIAGNOSIS — H25013 Cortical age-related cataract, bilateral: Secondary | ICD-10-CM | POA: Diagnosis not present

## 2021-06-02 DIAGNOSIS — H35363 Drusen (degenerative) of macula, bilateral: Secondary | ICD-10-CM | POA: Diagnosis not present

## 2021-06-02 DIAGNOSIS — H2513 Age-related nuclear cataract, bilateral: Secondary | ICD-10-CM | POA: Diagnosis not present

## 2021-06-16 DIAGNOSIS — M5459 Other low back pain: Secondary | ICD-10-CM | POA: Diagnosis not present

## 2021-06-16 DIAGNOSIS — Z4889 Encounter for other specified surgical aftercare: Secondary | ICD-10-CM | POA: Diagnosis not present

## 2021-07-22 DIAGNOSIS — M0589 Other rheumatoid arthritis with rheumatoid factor of multiple sites: Secondary | ICD-10-CM | POA: Diagnosis not present

## 2021-08-07 ENCOUNTER — Ambulatory Visit (INDEPENDENT_AMBULATORY_CARE_PROVIDER_SITE_OTHER): Payer: Medicare Other

## 2021-08-07 ENCOUNTER — Ambulatory Visit (INDEPENDENT_AMBULATORY_CARE_PROVIDER_SITE_OTHER): Payer: Medicare Other | Admitting: Podiatry

## 2021-08-07 ENCOUNTER — Other Ambulatory Visit: Payer: Self-pay

## 2021-08-07 DIAGNOSIS — M79672 Pain in left foot: Secondary | ICD-10-CM

## 2021-08-07 DIAGNOSIS — I7300 Raynaud's syndrome without gangrene: Secondary | ICD-10-CM | POA: Diagnosis not present

## 2021-08-07 NOTE — Progress Notes (Signed)
Subjective: 76 year old female presents the office today for concerns of a 'bruise" to her left big toe.  She states she gets discomfort in both of her big toes.  She states that she only gets discomfort when it is cold outside.  She states that at times her toes do changed to darker color.  She does not see any open sores or any drainage.  She said no recent treatment for this.  Objective: AAO x3, NAD DP/PT pulses palpable 2/4 bilaterally, CRT less than 3 seconds On the plantar aspect of the left hallux is a flat, purple discoloration present.  There is no opening or drainage.  There is no fluctuation.  No significant pain on exam to the toes today.  There are no open lesions. No pain with calf compression, swelling, warmth, erythema     Assessment: Likely Raynaud's  Plan: -All treatment options discussed with the patient including all alternatives, risks, complications.  -Given her symptoms mostly in the cold weather with the discoloration I do think this is more from Raynaud's.  We discussed wearing thicker socks and changing them regularly.  I did prescribe Pearson ointment to apply to the area of the toe.  Dispensed offloading pads as well.  She needs to monitor for any skin breakdown or any signs of infection. -Patient encouraged to call the office with any questions, concerns, change in symptoms.   Trula Slade DPM

## 2021-08-07 NOTE — Patient Instructions (Signed)
Raynaud's Phenomenon Raynaud's phenomenon is a condition that affects the blood vessels (arteries) that carry blood to the fingers and toes. The arteries that supply blood to the ears, lips, nipples, or the tip of the nose might also be affected. Raynaud's phenomenon causes the arteries to become narrow temporarily (spasm). As a result, the flow of blood to the affected areas is temporarily decreased. This usually occurs in response to cold temperatures or stress. During an attack, the skin in the affected areas turns white, then blue, and finally red. A person may also feel tingling or numbness in those areas. Attacks usually last for only a brief period, and then the blood flow to the area returns to normal. In most cases, Raynaud's phenomenon does not cause serious health problems. What are the causes? In many cases, the cause of this condition is not known. The condition may occur on its own (primary Raynaud's phenomenon) or may be associated with other diseases or factors (secondary Raynaud's phenomenon). Possible causes may include: Diseases or medical conditions that damage the arteries. Injuries and repetitive actions that hurt the hands or feet. Being exposed to certain chemicals. Taking medicines that narrow the arteries. Other medical conditions, such as lupus, scleroderma, rheumatoid arthritis, thyroid problems, blood disorders, Sjogren syndrome, or atherosclerosis. What increases the risk? The following factors may make you more likely to develop this condition: Being 72-47 years old. Being female. Having a family history of Raynaud's phenomenon. Living in a cold climate. Smoking. What are the signs or symptoms? Symptoms of this condition usually occur when you are exposed to cold temperatures or when you have emotional stress. The symptoms may last for a few minutes or up to several hours. They usually affect your fingers but may also affect your toes, nipples, lips, ears, or the tip  of your nose. Symptoms may include: Changes in skin color. The skin in the affected areas will turn pale or white. The skin may then change from white to bluish to red as normal blood flow returns to the area. Numbness, tingling, or pain in the affected areas. In severe cases, symptoms may include: Skin sores. Tissues decaying and dying (gangrene). How is this diagnosed? This condition may be diagnosed based on: Your symptoms and medical history. A physical exam. During the exam, you may be asked to put your hands in cold water to check for a reaction to cold temperature. Tests, such as: Blood tests to check for other diseases or conditions. A test to check the movement of blood through your arteries and veins (vascular ultrasound). A test in which the skin at the base of your fingernail is examined under a microscope (nailfold capillaroscopy). How is this treated? During an episode, you can take actions to help symptoms go away faster. Options include moving your arms around in a windmill pattern, warming your fingers under warm water, or placing your fingers in a warm body fold, such as your armpit. Long-term treatment for this condition often involves making lifestyle changes and taking steps to control your exposure to cold temperature. For more severe cases, medicine (calcium channel blockers) may be used to improve blood circulation. Follow these instructions at home: Avoiding cold temperatures Take these steps to avoid exposure to cold: If possible, stay indoors during cold weather. When you go outside during cold weather, dress in layers and wear mittens, a hat, a scarf, and warm footwear. Wear mittens or gloves when handling ice or frozen food. Use holders for glasses or cans containing cold  drinks. Let warm water run for a while before taking a shower or bath. Warm up the car before driving in cold weather. Lifestyle If possible, avoid stressful and emotional situations. Try to  find ways to manage your stress, such as: Exercise. Yoga. Meditation. Biofeedback. Do not use any products that contain nicotine or tobacco. These products include cigarettes, chewing tobacco, and vaping devices, such as e-cigarettes. If you need help quitting, ask your health care provider. Avoid secondhand smoke. Limit your use of caffeine. Switch to decaffeinated coffee, tea, and soda. Avoid chocolate. Avoid vibrating tools and machinery. General instructions Protect your hands and feet from injuries, cuts, or bruises. Avoid wearing tight rings or wristbands. Wear loose fitting socks and comfortable, roomy shoes. Take over-the-counter and prescription medicines only as told by your health care provider. Where to find support Raynaud's Association: www.raynauds.org Where to find more information [employer] of Arthritis and Musculoskeletal and Skin Diseases: www.niams.SouthExposed.es Contact a health care provider if: Your discomfort becomes worse despite lifestyle changes. You develop sores on your fingers or toes that do not heal. You have breaks in the skin on your fingers or toes. You have a fever. You have pain or swelling in your joints. You have a rash. Your symptoms occur on only one side of your body. Get help right away if: Your fingers or toes turn black. You have severe pain in the affected areas. These symptoms may represent a serious problem that is an emergency. Do not wait to see if the symptoms will go away. Get medical help right away. Call your local emergency services (911 in the [place] Do not drive yourself to the hospital. Summary Raynaud's phenomenon is a condition that affects the arteries that carry blood to the fingers, toes, ears, lips, nipples, or the tip of the nose. In many cases, the cause of this condition is not known. Symptoms of this condition include changes in skin color along with numbness and tingling in the affected area. Treatment for this  condition includes lifestyle changes and reducing exposure to cold temperatures. Medicines may be used for severe cases of the condition. Contact your health care provider if your condition worsens despite treatment. This information is not intended to replace advice given to you by your health care provider. Make sure you discuss any questions you have with your health care provider. Document Revised: 09/03/2020 Document Reviewed: 09/03/2020 Elsevier Patient Education  2022 [employer].

## 2021-08-11 ENCOUNTER — Telehealth: Payer: Self-pay | Admitting: *Deleted

## 2021-08-11 ENCOUNTER — Other Ambulatory Visit: Payer: Self-pay | Admitting: Podiatry

## 2021-08-11 MED ORDER — MUPIROCIN 2 % EX OINT: 1.0000 "application " | TOPICAL_OINTMENT | Freq: Two times a day (BID) | CUTANEOUS | 2 refills | Status: DC

## 2021-08-11 NOTE — Telephone Encounter (Signed)
Patient is calling for status of a medication ointment that was supposed to be sent to pharmacy, not there. Please advise.

## 2021-09-16 DIAGNOSIS — M0589 Other rheumatoid arthritis with rheumatoid factor of multiple sites: Secondary | ICD-10-CM | POA: Diagnosis not present

## 2021-11-05 DIAGNOSIS — E663 Overweight: Secondary | ICD-10-CM | POA: Diagnosis not present

## 2021-11-05 DIAGNOSIS — Z6826 Body mass index (BMI) 26.0-26.9, adult: Secondary | ICD-10-CM | POA: Diagnosis not present

## 2021-11-05 DIAGNOSIS — M0589 Other rheumatoid arthritis with rheumatoid factor of multiple sites: Secondary | ICD-10-CM | POA: Diagnosis not present

## 2021-11-05 DIAGNOSIS — Z79899 Other long term (current) drug therapy: Secondary | ICD-10-CM | POA: Diagnosis not present

## 2021-11-05 DIAGNOSIS — K760 Fatty (change of) liver, not elsewhere classified: Secondary | ICD-10-CM | POA: Diagnosis not present

## 2021-11-11 DIAGNOSIS — Z111 Encounter for screening for respiratory tuberculosis: Secondary | ICD-10-CM | POA: Diagnosis not present

## 2021-11-11 DIAGNOSIS — L280 Lichen simplex chronicus: Secondary | ICD-10-CM | POA: Diagnosis not present

## 2021-11-11 DIAGNOSIS — R5383 Other fatigue: Secondary | ICD-10-CM | POA: Diagnosis not present

## 2021-11-11 DIAGNOSIS — L301 Dyshidrosis [pompholyx]: Secondary | ICD-10-CM | POA: Diagnosis not present

## 2021-11-11 DIAGNOSIS — Z79899 Other long term (current) drug therapy: Secondary | ICD-10-CM | POA: Diagnosis not present

## 2021-11-11 DIAGNOSIS — M0589 Other rheumatoid arthritis with rheumatoid factor of multiple sites: Secondary | ICD-10-CM | POA: Diagnosis not present

## 2022-01-06 DIAGNOSIS — L309 Dermatitis, unspecified: Secondary | ICD-10-CM | POA: Diagnosis not present

## 2022-01-06 DIAGNOSIS — D485 Neoplasm of uncertain behavior of skin: Secondary | ICD-10-CM | POA: Diagnosis not present

## 2022-01-06 DIAGNOSIS — L301 Dyshidrosis [pompholyx]: Secondary | ICD-10-CM | POA: Diagnosis not present

## 2022-01-06 DIAGNOSIS — L280 Lichen simplex chronicus: Secondary | ICD-10-CM | POA: Diagnosis not present

## 2022-01-06 DIAGNOSIS — M0589 Other rheumatoid arthritis with rheumatoid factor of multiple sites: Secondary | ICD-10-CM | POA: Diagnosis not present

## 2022-03-03 ENCOUNTER — Other Ambulatory Visit: Payer: Self-pay | Admitting: Family Medicine

## 2022-03-03 DIAGNOSIS — M0589 Other rheumatoid arthritis with rheumatoid factor of multiple sites: Secondary | ICD-10-CM | POA: Diagnosis not present

## 2022-03-03 DIAGNOSIS — Z1231 Encounter for screening mammogram for malignant neoplasm of breast: Secondary | ICD-10-CM

## 2022-03-03 DIAGNOSIS — Z79899 Other long term (current) drug therapy: Secondary | ICD-10-CM | POA: Diagnosis not present

## 2022-04-09 DIAGNOSIS — H6123 Impacted cerumen, bilateral: Secondary | ICD-10-CM | POA: Diagnosis not present

## 2022-04-13 ENCOUNTER — Ambulatory Visit: Payer: Medicare Other

## 2022-04-14 DIAGNOSIS — L308 Other specified dermatitis: Secondary | ICD-10-CM | POA: Diagnosis not present

## 2022-04-22 DIAGNOSIS — I10 Essential (primary) hypertension: Secondary | ICD-10-CM | POA: Diagnosis not present

## 2022-04-22 DIAGNOSIS — E7800 Pure hypercholesterolemia, unspecified: Secondary | ICD-10-CM | POA: Diagnosis not present

## 2022-04-28 DIAGNOSIS — I679 Cerebrovascular disease, unspecified: Secondary | ICD-10-CM | POA: Diagnosis not present

## 2022-04-28 DIAGNOSIS — B009 Herpesviral infection, unspecified: Secondary | ICD-10-CM | POA: Diagnosis not present

## 2022-04-28 DIAGNOSIS — Z0000 Encounter for general adult medical examination without abnormal findings: Secondary | ICD-10-CM | POA: Diagnosis not present

## 2022-04-28 DIAGNOSIS — H6120 Impacted cerumen, unspecified ear: Secondary | ICD-10-CM | POA: Diagnosis not present

## 2022-04-28 DIAGNOSIS — I10 Essential (primary) hypertension: Secondary | ICD-10-CM | POA: Diagnosis not present

## 2022-04-28 DIAGNOSIS — E7800 Pure hypercholesterolemia, unspecified: Secondary | ICD-10-CM | POA: Diagnosis not present

## 2022-04-28 DIAGNOSIS — Z6826 Body mass index (BMI) 26.0-26.9, adult: Secondary | ICD-10-CM | POA: Diagnosis not present

## 2022-04-28 DIAGNOSIS — K219 Gastro-esophageal reflux disease without esophagitis: Secondary | ICD-10-CM | POA: Diagnosis not present

## 2022-04-29 DIAGNOSIS — Z79899 Other long term (current) drug therapy: Secondary | ICD-10-CM | POA: Diagnosis not present

## 2022-04-29 DIAGNOSIS — M0589 Other rheumatoid arthritis with rheumatoid factor of multiple sites: Secondary | ICD-10-CM | POA: Diagnosis not present

## 2022-05-01 ENCOUNTER — Ambulatory Visit
Admission: RE | Admit: 2022-05-01 | Discharge: 2022-05-01 | Disposition: A | Discharge status: Home / Self Care | Payer: Medicare Other | Source: Ambulatory Visit | Attending: Family Medicine | Admitting: Family Medicine

## 2022-05-01 DIAGNOSIS — Z1231 Encounter for screening mammogram for malignant neoplasm of breast: Secondary | ICD-10-CM | POA: Diagnosis not present

## 2022-05-05 DIAGNOSIS — Z79899 Other long term (current) drug therapy: Secondary | ICD-10-CM | POA: Diagnosis not present

## 2022-05-05 DIAGNOSIS — E663 Overweight: Secondary | ICD-10-CM | POA: Diagnosis not present

## 2022-05-05 DIAGNOSIS — K760 Fatty (change of) liver, not elsewhere classified: Secondary | ICD-10-CM | POA: Diagnosis not present

## 2022-05-05 DIAGNOSIS — Z6826 Body mass index (BMI) 26.0-26.9, adult: Secondary | ICD-10-CM | POA: Diagnosis not present

## 2022-05-05 DIAGNOSIS — M0589 Other rheumatoid arthritis with rheumatoid factor of multiple sites: Secondary | ICD-10-CM | POA: Diagnosis not present

## 2022-05-06 ENCOUNTER — Other Ambulatory Visit: Payer: Self-pay | Admitting: Family Medicine

## 2022-05-06 DIAGNOSIS — R928 Other abnormal and inconclusive findings on diagnostic imaging of breast: Secondary | ICD-10-CM

## 2022-05-15 ENCOUNTER — Ambulatory Visit
Admission: RE | Admit: 2022-05-15 | Discharge: 2022-05-15 | Disposition: A | Discharge status: Home / Self Care | Payer: Medicare Other | Source: Ambulatory Visit | Attending: Family Medicine | Admitting: Family Medicine

## 2022-05-15 DIAGNOSIS — N6489 Other specified disorders of breast: Secondary | ICD-10-CM | POA: Diagnosis not present

## 2022-05-15 DIAGNOSIS — R92322 Mammographic fibroglandular density, left breast: Secondary | ICD-10-CM | POA: Diagnosis not present

## 2022-05-15 DIAGNOSIS — R928 Other abnormal and inconclusive findings on diagnostic imaging of breast: Secondary | ICD-10-CM

## 2022-06-08 DIAGNOSIS — H04123 Dry eye syndrome of bilateral lacrimal glands: Secondary | ICD-10-CM | POA: Diagnosis not present

## 2022-06-08 DIAGNOSIS — H524 Presbyopia: Secondary | ICD-10-CM | POA: Diagnosis not present

## 2022-06-08 DIAGNOSIS — H35363 Drusen (degenerative) of macula, bilateral: Secondary | ICD-10-CM | POA: Diagnosis not present

## 2022-06-08 DIAGNOSIS — H25813 Combined forms of age-related cataract, bilateral: Secondary | ICD-10-CM | POA: Diagnosis not present

## 2022-06-24 DIAGNOSIS — Z79899 Other long term (current) drug therapy: Secondary | ICD-10-CM | POA: Diagnosis not present

## 2022-06-24 DIAGNOSIS — M0589 Other rheumatoid arthritis with rheumatoid factor of multiple sites: Secondary | ICD-10-CM | POA: Diagnosis not present

## 2022-06-29 DIAGNOSIS — R0981 Nasal congestion: Secondary | ICD-10-CM | POA: Diagnosis not present

## 2022-06-29 DIAGNOSIS — R6889 Other general symptoms and signs: Secondary | ICD-10-CM | POA: Diagnosis not present

## 2022-06-29 DIAGNOSIS — Z03818 Encounter for observation for suspected exposure to other biological agents ruled out: Secondary | ICD-10-CM | POA: Diagnosis not present

## 2022-06-29 DIAGNOSIS — J101 Influenza due to other identified influenza virus with other respiratory manifestations: Secondary | ICD-10-CM | POA: Diagnosis not present

## 2022-06-29 DIAGNOSIS — R059 Cough, unspecified: Secondary | ICD-10-CM | POA: Diagnosis not present

## 2022-07-29 DIAGNOSIS — H6123 Impacted cerumen, bilateral: Secondary | ICD-10-CM | POA: Diagnosis not present

## 2022-07-29 DIAGNOSIS — H838X3 Other specified diseases of inner ear, bilateral: Secondary | ICD-10-CM | POA: Diagnosis not present

## 2022-07-29 DIAGNOSIS — H903 Sensorineural hearing loss, bilateral: Secondary | ICD-10-CM | POA: Diagnosis not present

## 2022-08-19 DIAGNOSIS — R5383 Other fatigue: Secondary | ICD-10-CM | POA: Diagnosis not present

## 2022-08-19 DIAGNOSIS — M0589 Other rheumatoid arthritis with rheumatoid factor of multiple sites: Secondary | ICD-10-CM | POA: Diagnosis not present

## 2022-08-19 DIAGNOSIS — Z79899 Other long term (current) drug therapy: Secondary | ICD-10-CM | POA: Diagnosis not present

## 2022-10-14 DIAGNOSIS — Z79899 Other long term (current) drug therapy: Secondary | ICD-10-CM | POA: Diagnosis not present

## 2022-10-14 DIAGNOSIS — R5383 Other fatigue: Secondary | ICD-10-CM | POA: Diagnosis not present

## 2022-10-14 DIAGNOSIS — M0589 Other rheumatoid arthritis with rheumatoid factor of multiple sites: Secondary | ICD-10-CM | POA: Diagnosis not present

## 2022-10-14 DIAGNOSIS — Z111 Encounter for screening for respiratory tuberculosis: Secondary | ICD-10-CM | POA: Diagnosis not present

## 2022-10-19 DIAGNOSIS — M25561 Pain in right knee: Secondary | ICD-10-CM | POA: Diagnosis not present

## 2022-10-19 DIAGNOSIS — E663 Overweight: Secondary | ICD-10-CM | POA: Diagnosis not present

## 2022-10-19 DIAGNOSIS — M0589 Other rheumatoid arthritis with rheumatoid factor of multiple sites: Secondary | ICD-10-CM | POA: Diagnosis not present

## 2022-10-19 DIAGNOSIS — Z6826 Body mass index (BMI) 26.0-26.9, adult: Secondary | ICD-10-CM | POA: Diagnosis not present

## 2022-10-19 DIAGNOSIS — M25562 Pain in left knee: Secondary | ICD-10-CM | POA: Diagnosis not present

## 2022-10-19 DIAGNOSIS — Z79899 Other long term (current) drug therapy: Secondary | ICD-10-CM | POA: Diagnosis not present

## 2022-11-30 DIAGNOSIS — M1711 Unilateral primary osteoarthritis, right knee: Secondary | ICD-10-CM | POA: Diagnosis not present

## 2022-11-30 DIAGNOSIS — M25562 Pain in left knee: Secondary | ICD-10-CM | POA: Diagnosis not present

## 2022-12-09 DIAGNOSIS — Z79899 Other long term (current) drug therapy: Secondary | ICD-10-CM | POA: Diagnosis not present

## 2022-12-09 DIAGNOSIS — M0589 Other rheumatoid arthritis with rheumatoid factor of multiple sites: Secondary | ICD-10-CM | POA: Diagnosis not present

## 2023-01-10 IMAGING — MG MM DIGITAL SCREENING BILAT W/ TOMO AND CAD
8 series · 9 of 24 positions shown · non-contrast
Comparison: Previous exam(s).

CLINICAL DATA: Screening.

EXAM:
DIGITAL SCREENING BILATERAL MAMMOGRAM WITH TOMOSYNTHESIS AND CAD
TECHNIQUE: Bilateral screening digital craniocaudal and mediolateral oblique
mammograms were obtained. Bilateral screening digital breast
tomosynthesis was performed. The images were evaluated with
computer-aided detection.

[R MLO synth-2D]
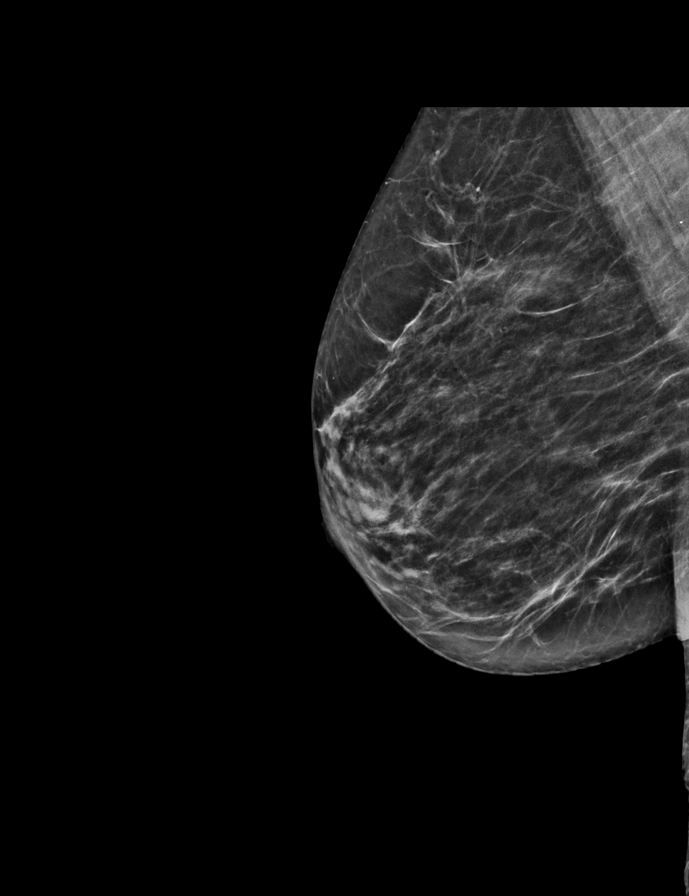

[R CC synth-2D]
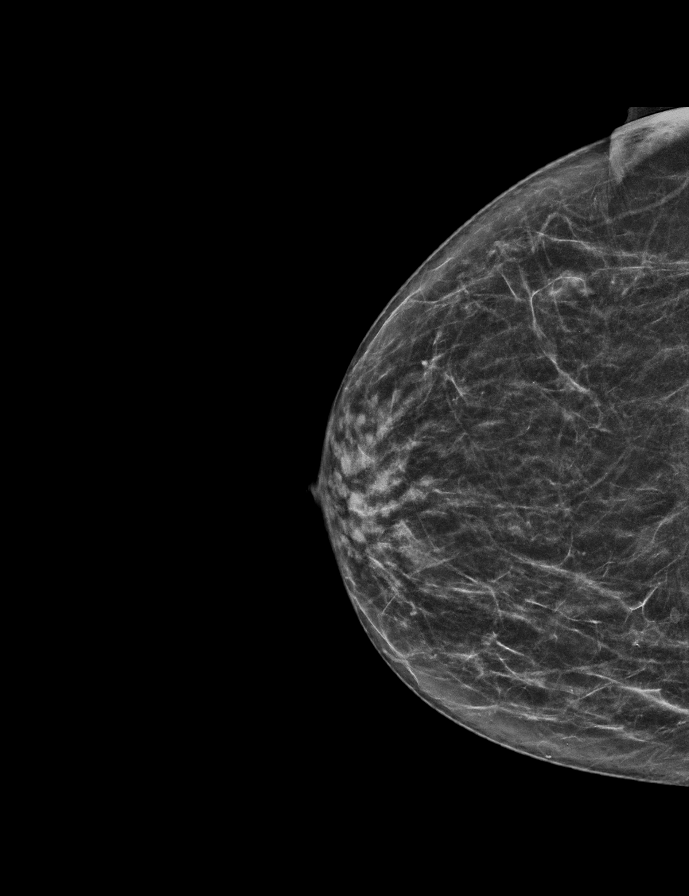

[L MLO synth-2D]
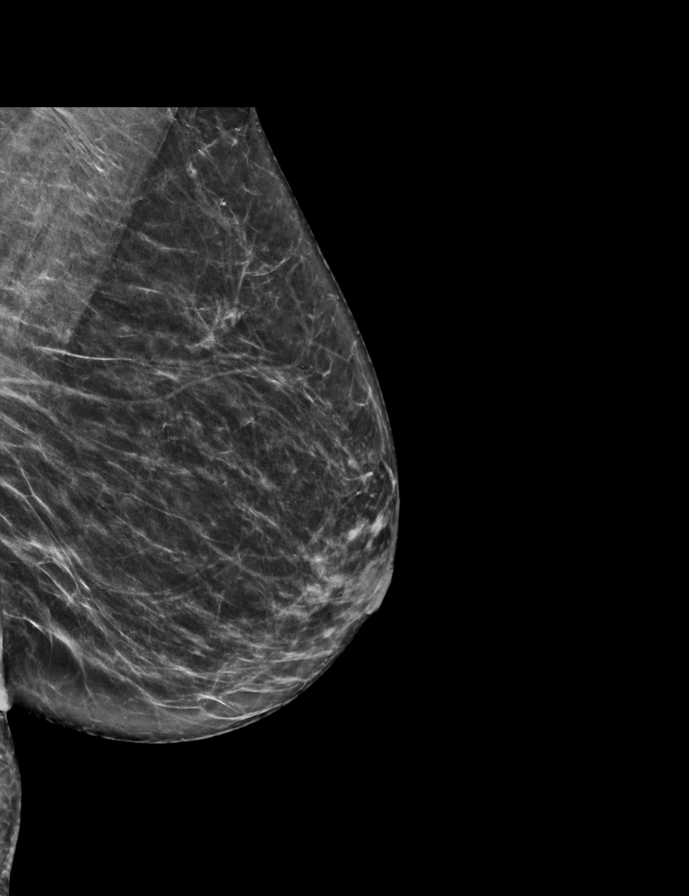

[L CC synth-2D]
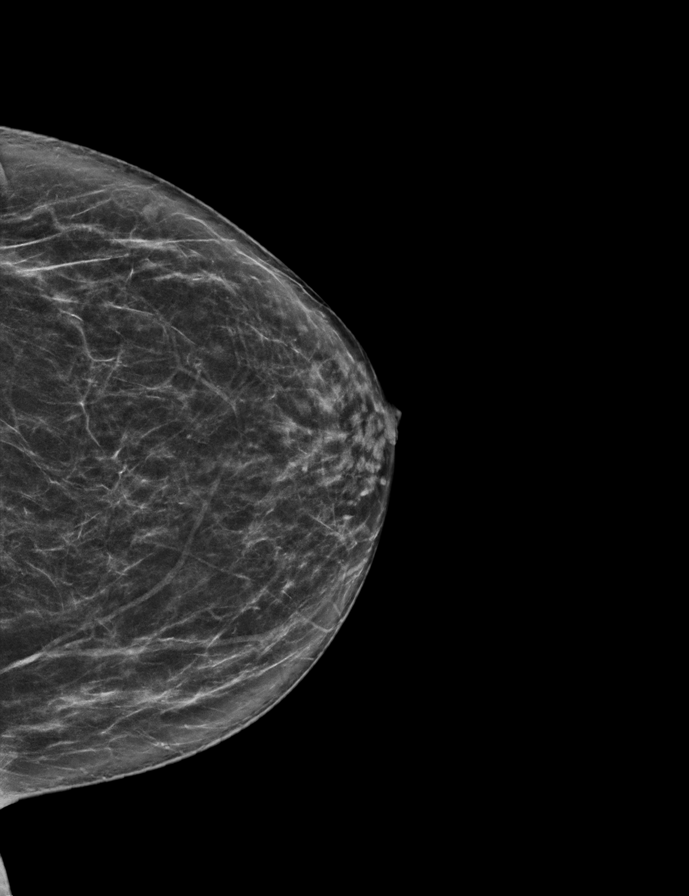

[L MLO tomo · 2 of 63 frames shown]
[frame 21/63]
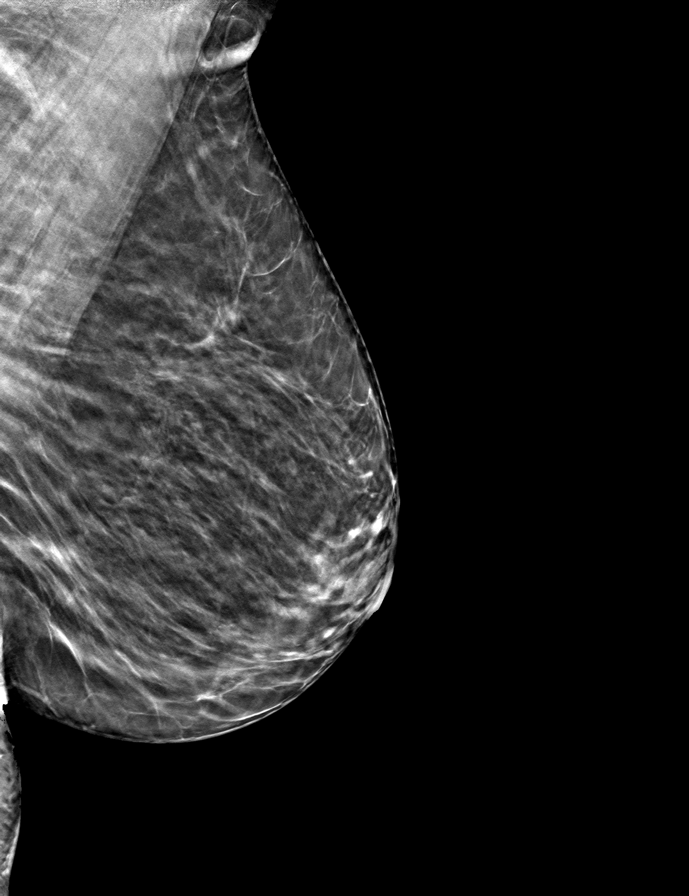
[frame 32/63]
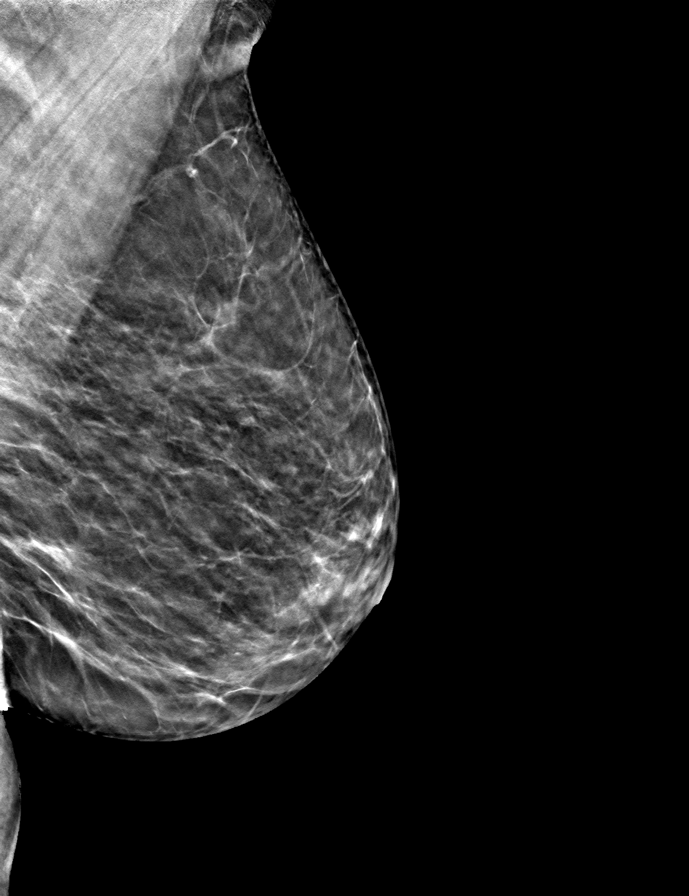

[R MLO tomo · tomo slice 31/60.0]
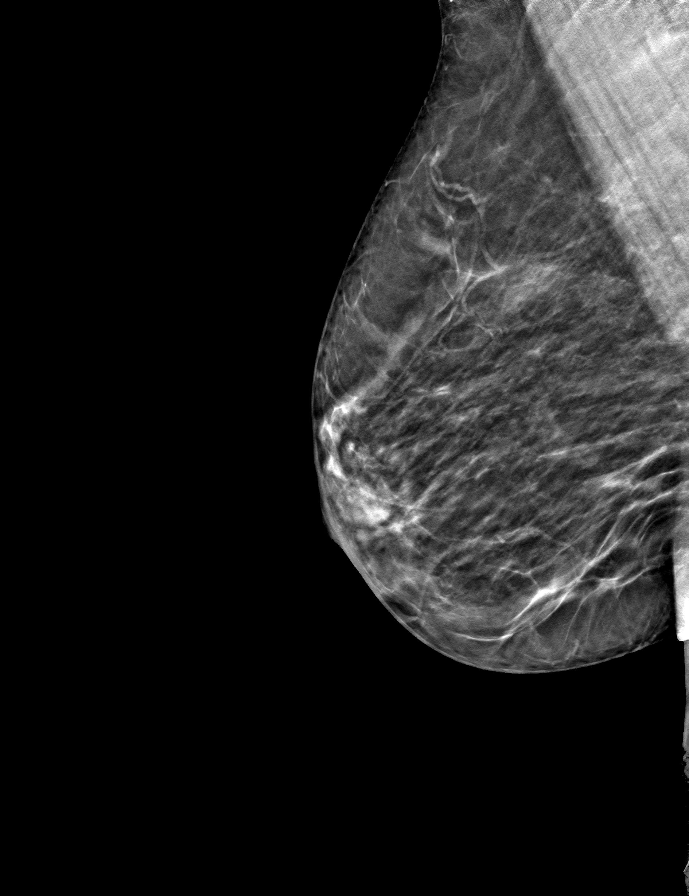

[R CC tomo · tomo slice 27/54.0]
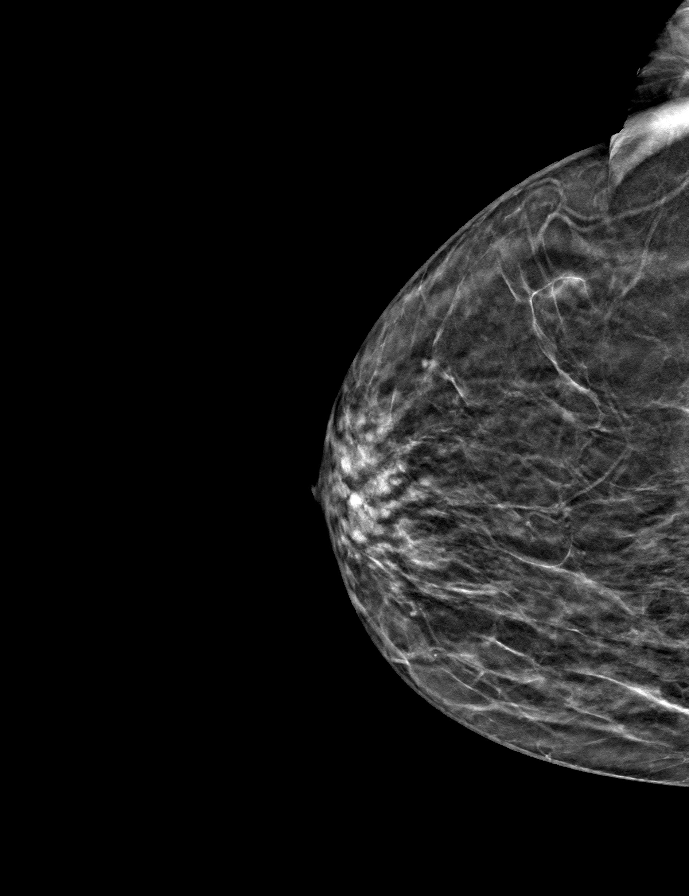

[L CC tomo · tomo slice 31/62.0]
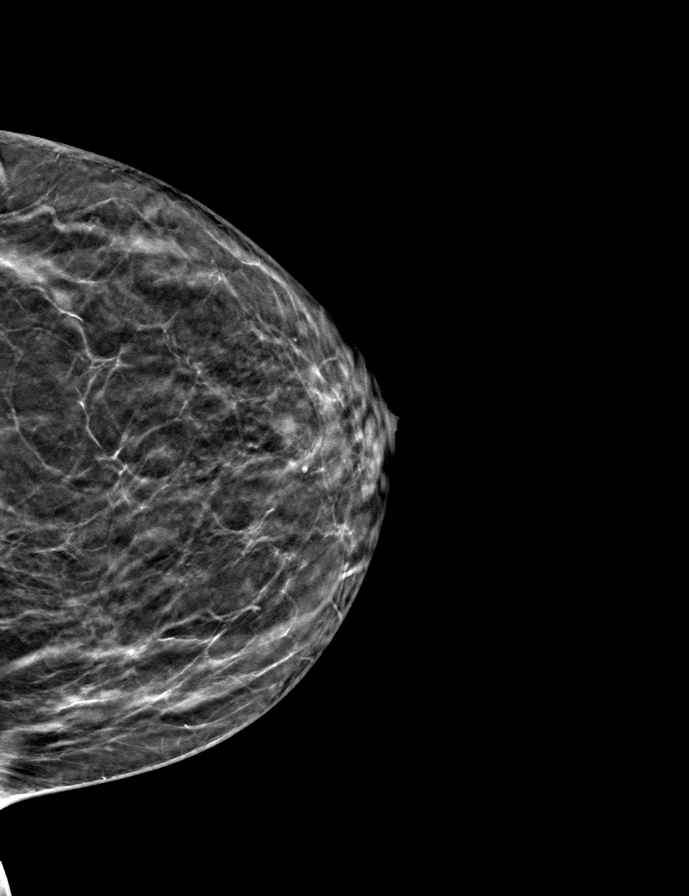

[9 of 24 positions shown; findings below may reference images not displayed]

ACR Breast Density Category b: There are scattered areas of
fibroglandular density.
FINDINGS: There are no findings suspicious for malignancy.
IMPRESSION: No mammographic evidence of malignancy. A result letter of this
screening mammogram will be mailed directly to the patient.

RECOMMENDATION:
Screening mammogram in one year. (Code:51-O-LD2)

BI-RADS CATEGORY  1: Negative.

## 2023-01-18 DIAGNOSIS — M1711 Unilateral primary osteoarthritis, right knee: Secondary | ICD-10-CM | POA: Diagnosis not present

## 2023-01-25 DIAGNOSIS — M1711 Unilateral primary osteoarthritis, right knee: Secondary | ICD-10-CM | POA: Diagnosis not present

## 2023-02-01 DIAGNOSIS — M1711 Unilateral primary osteoarthritis, right knee: Secondary | ICD-10-CM | POA: Diagnosis not present

## 2023-02-03 DIAGNOSIS — Z79899 Other long term (current) drug therapy: Secondary | ICD-10-CM | POA: Diagnosis not present

## 2023-02-03 DIAGNOSIS — R5383 Other fatigue: Secondary | ICD-10-CM | POA: Diagnosis not present

## 2023-02-03 DIAGNOSIS — M0589 Other rheumatoid arthritis with rheumatoid factor of multiple sites: Secondary | ICD-10-CM | POA: Diagnosis not present

## 2023-03-31 DIAGNOSIS — M0589 Other rheumatoid arthritis with rheumatoid factor of multiple sites: Secondary | ICD-10-CM | POA: Diagnosis not present

## 2023-03-31 DIAGNOSIS — Z79899 Other long term (current) drug therapy: Secondary | ICD-10-CM | POA: Diagnosis not present

## 2023-04-07 ENCOUNTER — Other Ambulatory Visit: Payer: Self-pay | Admitting: Family Medicine

## 2023-04-07 DIAGNOSIS — Z1231 Encounter for screening mammogram for malignant neoplasm of breast: Secondary | ICD-10-CM

## 2023-04-26 DIAGNOSIS — M0589 Other rheumatoid arthritis with rheumatoid factor of multiple sites: Secondary | ICD-10-CM | POA: Diagnosis not present

## 2023-04-26 DIAGNOSIS — E663 Overweight: Secondary | ICD-10-CM | POA: Diagnosis not present

## 2023-04-26 DIAGNOSIS — Z79899 Other long term (current) drug therapy: Secondary | ICD-10-CM | POA: Diagnosis not present

## 2023-04-26 DIAGNOSIS — R0789 Other chest pain: Secondary | ICD-10-CM | POA: Diagnosis not present

## 2023-04-26 DIAGNOSIS — Z6826 Body mass index (BMI) 26.0-26.9, adult: Secondary | ICD-10-CM | POA: Diagnosis not present

## 2023-05-04 DIAGNOSIS — M0589 Other rheumatoid arthritis with rheumatoid factor of multiple sites: Secondary | ICD-10-CM | POA: Diagnosis not present

## 2023-05-04 DIAGNOSIS — Z23 Encounter for immunization: Secondary | ICD-10-CM | POA: Diagnosis not present

## 2023-05-04 DIAGNOSIS — Z0000 Encounter for general adult medical examination without abnormal findings: Secondary | ICD-10-CM | POA: Diagnosis not present

## 2023-05-04 DIAGNOSIS — E7800 Pure hypercholesterolemia, unspecified: Secondary | ICD-10-CM | POA: Diagnosis not present

## 2023-05-04 DIAGNOSIS — I10 Essential (primary) hypertension: Secondary | ICD-10-CM | POA: Diagnosis not present

## 2023-05-04 DIAGNOSIS — K219 Gastro-esophageal reflux disease without esophagitis: Secondary | ICD-10-CM | POA: Diagnosis not present

## 2023-05-04 DIAGNOSIS — Z6826 Body mass index (BMI) 26.0-26.9, adult: Secondary | ICD-10-CM | POA: Diagnosis not present

## 2023-05-04 DIAGNOSIS — B009 Herpesviral infection, unspecified: Secondary | ICD-10-CM | POA: Diagnosis not present

## 2023-05-05 ENCOUNTER — Other Ambulatory Visit (HOSPITAL_COMMUNITY): Payer: Self-pay | Admitting: Family Medicine

## 2023-05-05 DIAGNOSIS — E7800 Pure hypercholesterolemia, unspecified: Secondary | ICD-10-CM

## 2023-05-14 ENCOUNTER — Ambulatory Visit: Payer: Medicare Other

## 2023-05-17 ENCOUNTER — Encounter (HOSPITAL_COMMUNITY)
Admission: RE | Admit: 2023-05-17 | Discharge: 2023-05-17 | Disposition: A | Discharge status: Home / Self Care | Payer: Medicare Other | Source: Ambulatory Visit | Attending: Family Medicine | Admitting: Family Medicine

## 2023-05-17 DIAGNOSIS — I700 Atherosclerosis of aorta: Secondary | ICD-10-CM | POA: Insufficient documentation

## 2023-05-17 DIAGNOSIS — E7800 Pure hypercholesterolemia, unspecified: Secondary | ICD-10-CM | POA: Insufficient documentation

## 2023-05-27 DIAGNOSIS — Z79899 Other long term (current) drug therapy: Secondary | ICD-10-CM | POA: Diagnosis not present

## 2023-05-27 DIAGNOSIS — R5383 Other fatigue: Secondary | ICD-10-CM | POA: Diagnosis not present

## 2023-05-27 DIAGNOSIS — M0589 Other rheumatoid arthritis with rheumatoid factor of multiple sites: Secondary | ICD-10-CM | POA: Diagnosis not present

## 2023-05-28 ENCOUNTER — Ambulatory Visit
Admission: RE | Admit: 2023-05-28 | Discharge: 2023-05-28 | Disposition: A | Discharge status: Home / Self Care | Payer: Medicare Other | Source: Ambulatory Visit | Attending: Family Medicine | Admitting: Family Medicine

## 2023-05-28 DIAGNOSIS — Z1231 Encounter for screening mammogram for malignant neoplasm of breast: Secondary | ICD-10-CM | POA: Diagnosis not present

## 2023-06-04 ENCOUNTER — Institutional Professional Consult (permissible substitution): Payer: Medicare Other | Admitting: Pulmonary Disease

## 2023-06-04 ENCOUNTER — Encounter: Payer: Self-pay | Admitting: Pulmonary Disease

## 2023-06-04 ENCOUNTER — Ambulatory Visit: Payer: Medicare Other | Admitting: Pulmonary Disease

## 2023-06-04 VITALS — BP 126/74 | HR 78 | Temp 97.8°F | Ht 67.0 in | Wt 157.6 lb

## 2023-06-04 DIAGNOSIS — R0689 Other abnormalities of breathing: Secondary | ICD-10-CM | POA: Diagnosis not present

## 2023-06-04 DIAGNOSIS — R0600 Dyspnea, unspecified: Secondary | ICD-10-CM | POA: Diagnosis not present

## 2023-06-04 LAB — CBC WITH DIFFERENTIAL/PLATELET
Basophils Absolute: 0 10*3/uL (ref 0.0–0.1)
Basophils Relative: 0.9 % (ref 0.0–3.0)
Eosinophils Absolute: 0.2 10*3/uL (ref 0.0–0.7)
Eosinophils Relative: 4.4 % (ref 0.0–5.0)
HCT: 38.4 % (ref 36.0–46.0)
Hemoglobin: 12.5 g/dL (ref 12.0–15.0)
Lymphocytes Relative: 34.7 % (ref 12.0–46.0)
Lymphs Abs: 1.6 10*3/uL (ref 0.7–4.0)
MCHC: 32.5 g/dL (ref 30.0–36.0)
MCV: 88.8 fL (ref 78.0–100.0)
Monocytes Absolute: 0.4 10*3/uL (ref 0.1–1.0)
Monocytes Relative: 8 % (ref 3.0–12.0)
Neutro Abs: 2.4 10*3/uL (ref 1.4–7.7)
Neutrophils Relative %: 52 % (ref 43.0–77.0)
Platelets: 244 10*3/uL (ref 150.0–400.0)
RBC: 4.32 Mil/uL (ref 3.87–5.11)
RDW: 13.7 % (ref 11.5–15.5)
WBC: 4.5 10*3/uL (ref 4.0–10.5)

## 2023-06-04 NOTE — Progress Notes (Addendum)
Karen Delgado    [id number]    1946/01/04  Primary Care Physician:Ross, Darlen Round, MD  Referring Physician: Daisy Floro, MD [address] [place],  [place] [address]  Chief complaint:    HPI: 77 y.o. who  has a past medical history of Hypertension, Rheumatoid arthritis (HCC), and Stroke [facility]).  Discussed the use of AI scribe software for clinical note transcription with the patient, who gave verbal consent to proceed.  Karen Delgado, a retired Paramedic, presents with intermittent shortness of breath that occurs without any identifiable triggers. The episodes are brief and are associated with a sensation of pressure in the chest and a dry cough. She initially thought it was related to her heart, but cardiac tests were normal.  She has a history of rheumatoid arthritis, diagnosed around 2005, for which she is currently treated with Simponi and Leflunomide. She was previously on Methotrexate. Her arthritis symptoms are well controlled on the current regimen.  She follows with Dr. Zenovia Jordan for this.    She also has a history of hypertension and a stroke in 2011. She quit smoking that same year, after a 15-20 year habit of half a pack a day. She has no known family history of lung disease. She has been using a feather pillow for a long time.  She also reports a history of a heart murmur, diagnosed by her primary care physician, Dr. Tenny Craw. She has not had any further testing for this.  She recently had a cardiac CT which showed some groundglass opacities and bronchial inflammation which will need further follow-up.  Rheumatology and primary care records reviewed.  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  She has feather pillows for many years Smoking history: 10-pack-year smoker.  Quit in 2011 Travel history: Originally from [place] Relevant family history: No significant family history of lung disease   Outpatient  Encounter Medications as of 06/04/2023  Medication Sig   ferrous sulfate 325 (65 FE) MG tablet Take 325 mg by mouth daily.   mupirocin ointment (BACTROBAN) 2 % Apply 1 application topically 2 (two) times daily.   ondansetron (ZOFRAN) 4 MG tablet Take 1 tablet (4 mg total) by mouth every 8 (eight) hours as needed for nausea or vomiting.   No facility-administered encounter medications on file as of 06/04/2023.    Physical Exam: Blood pressure 126/74, pulse 78, temperature 97.8 F (36.6 C), temperature source Oral, height 5\' 7"  (1.702 m), weight 157 lb 9.6 oz (71.5 kg), SpO2 98%. Gen:      No acute distress HEENT:  EOMI, sclera anicteric Neck:     No masses; no thyromegaly Lungs:    Clear to auscultation bilaterally; normal respiratory effort CV:         Regular rate and rhythm; no murmurs Abd:      + bowel sounds; soft, non-tender; no palpable masses, no distension Ext:    No edema; adequate peripheral perfusion Skin:      Warm and dry; no rash Neuro: alert and oriented x 3 Psych: normal mood and affect  Data Reviewed: Imaging: Cardiac CT 05/17/2023-scattered areas of peribronchovascular groundglass close bronchial wall thickening I have reviewed the images personally.  PFTs:  Labs:  Assessment and Plan Intermittent Shortness of Breath Unpredictable episodes of shortness of breath with no identifiable triggers. Possible differential diagnoses include rheumatoid lung disease, hypersensitivity pneumonitis due to down exposure, asthma, or COPD.  Recent cardiac  CT reviewed with groundglass opacities  -Order high resolution CT scan of the chest. -Order lung function tests.   -Draw blood for relevant tests.CTD serologies and hypersensitivity panel, CBC with differential and IgE  Rheumatoid Arthritis Long-standing diagnosis, currently managed with Simponi and Leflunomide. -Continue current management.  History of Stroke and Hypertension No current symptoms reported. -Continue  current management.  Heart Murmur Identified during physical examination, no prior diagnostic testing. -Order echocardiogram to further evaluate.   Recommendations: Labs PFTs High-res CT Echocardiogram  Chilton Greathouse MD [facility] 06/04/2023, 9:50 AM  CC: Daisy Floro, MD

## 2023-06-04 NOTE — Patient Instructions (Signed)
VISIT SUMMARY:  Today, we discussed your intermittent shortness of breath, rheumatoid arthritis, history of stroke and hypertension, and heart murmur. We have planned several tests to better understand your symptoms and ensure your conditions are well managed.  YOUR PLAN:  -INTERMITTENT SHORTNESS OF BREATH: You have been experiencing unpredictable episodes of shortness of breath. This could be due to several reasons, including lung issues related to your rheumatoid arthritis, allergies, asthma, or chronic obstructive pulmonary disease (COPD). We will perform a high-resolution CT scan of your chest, lung function tests, and draw blood for further analysis to determine the cause.  -RHEUMATOID ARTHRITIS: Your rheumatoid arthritis, which is an autoimmune condition causing joint inflammation, is currently well managed with Simponi and Leflunomide. We will continue with your current treatment plan.  -HISTORY OF STROKE AND HYPERTENSION: You have a history of stroke and high blood pressure, but you are not experiencing any current symptoms. We will continue with your current management plan to keep these conditions under control.  -HEART MURMUR: A heart murmur, which is an unusual sound heard between heartbeats, was identified during your physical examination. We will order an echocardiogram to further evaluate this condition.  INSTRUCTIONS:  Please schedule the high-resolution CT scan, lung function tests, and echocardiogram as soon as possible. Follow up with [place] once the tests are completed to discuss the results and next steps.

## 2023-06-08 LAB — IGE: IgE (Immunoglobulin E), Serum: 48 kU/L (ref <=114)

## 2023-06-08 LAB — ANA,IFA RA DIAG PNL W/RFLX TIT/PATN
Anti Nuclear Antibody (ANA): POSITIVE — AB
Cyclic Citrullin Peptide Ab: >250 U — ABNORMAL HIGH
Rheumatoid fact SerPl-aCnc: 91 [IU]/mL — ABNORMAL HIGH (ref <14)

## 2023-06-08 LAB — ANTI-NUCLEAR AB-TITER (ANA TITER): ANA Titer 1: 1:320 {titer} — ABNORMAL HIGH

## 2023-06-09 LAB — HYPERSENSITIVITY PNEUMONITIS
A. Pullulans Abs: NEGATIVE
A.Fumigatus #1 Abs: NEGATIVE
Micropolyspora faeni, IgG: NEGATIVE
Pigeon Serum Abs: NEGATIVE
Thermoact. Saccharii: NEGATIVE
Thermoactinomyces vulgaris, IgG: NEGATIVE

## 2023-06-14 ENCOUNTER — Ambulatory Visit
Admission: RE | Admit: 2023-06-14 | Discharge: 2023-06-14 | Disposition: A | Discharge status: Home / Self Care | Payer: Medicare Other | Source: Ambulatory Visit | Attending: Pulmonary Disease

## 2023-06-14 DIAGNOSIS — R0689 Other abnormalities of breathing: Secondary | ICD-10-CM

## 2023-06-14 DIAGNOSIS — R0602 Shortness of breath: Secondary | ICD-10-CM | POA: Diagnosis not present

## 2023-06-14 DIAGNOSIS — R911 Solitary pulmonary nodule: Secondary | ICD-10-CM | POA: Diagnosis not present

## 2023-06-14 DIAGNOSIS — R053 Chronic cough: Secondary | ICD-10-CM | POA: Diagnosis not present

## 2023-06-18 DIAGNOSIS — H524 Presbyopia: Secondary | ICD-10-CM | POA: Diagnosis not present

## 2023-06-18 DIAGNOSIS — H25813 Combined forms of age-related cataract, bilateral: Secondary | ICD-10-CM | POA: Diagnosis not present

## 2023-06-18 DIAGNOSIS — H04123 Dry eye syndrome of bilateral lacrimal glands: Secondary | ICD-10-CM | POA: Diagnosis not present

## 2023-06-18 DIAGNOSIS — H35363 Drusen (degenerative) of macula, bilateral: Secondary | ICD-10-CM | POA: Diagnosis not present

## 2023-07-27 DIAGNOSIS — M0589 Other rheumatoid arthritis with rheumatoid factor of multiple sites: Secondary | ICD-10-CM | POA: Diagnosis not present

## 2023-08-11 ENCOUNTER — Telehealth: Payer: Self-pay | Admitting: Pulmonary Disease

## 2023-08-11 DIAGNOSIS — R911 Solitary pulmonary nodule: Secondary | ICD-10-CM

## 2023-08-11 NOTE — Telephone Encounter (Signed)
Good Morning, Please contact patient with results of CT scan done in December 2024.  Thank you

## 2023-08-17 NOTE — Telephone Encounter (Signed)
I called and discussed results of the CT which does not show any ILD. There are small lung nodules that will need follow up and CT has been ordered for June of 2025. Nothing further needed.

## 2023-09-03 ENCOUNTER — Ambulatory Visit (HOSPITAL_BASED_OUTPATIENT_CLINIC_OR_DEPARTMENT_OTHER): Payer: Medicare Other | Admitting: Pulmonary Disease

## 2023-09-03 DIAGNOSIS — R0689 Other abnormalities of breathing: Secondary | ICD-10-CM

## 2023-09-03 DIAGNOSIS — R0600 Dyspnea, unspecified: Secondary | ICD-10-CM

## 2023-09-03 LAB — PULMONARY FUNCTION TEST
DL/VA % pred: 165 %
DL/VA: 6.76 ml/min/mmHg/L
DLCO cor % pred: 108 %
DLCO cor: 21.08 ml/min/mmHg
DLCO unc % pred: 108 %
DLCO unc: 21.08 ml/min/mmHg
FEF 25-75 Post: 2.32 L/s
FEF 25-75 Pre: 1.28 L/s
FEF2575-%Change-Post: 81 %
FEF2575-%Pred-Post: 141 %
FEF2575-%Pred-Pre: 78 %
FEV1-%Change-Post: 17 %
FEV1-%Pred-Post: 66 %
FEV1-%Pred-Pre: 56 %
FEV1-Post: 1.41 L
FEV1-Pre: 1.2 L
FEV1FVC-%Change-Post: 12 %
FEV1FVC-%Pred-Pre: 110 %
FEV6-%Change-Post: 4 %
FEV6-%Pred-Post: 56 %
FEV6-%Pred-Pre: 53 %
FEV6-Post: 1.52 L
FEV6-Pre: 1.45 L
FEV6FVC-%Pred-Post: 105 %
FEV6FVC-%Pred-Pre: 105 %
FVC-%Change-Post: 4 %
FVC-%Pred-Post: 53 %
FVC-%Pred-Pre: 51 %
FVC-Post: 1.52 L
FVC-Pre: 1.45 L
Post FEV1/FVC ratio: 93 %
Post FEV6/FVC ratio: 100 %
Pre FEV1/FVC ratio: 83 %
Pre FEV6/FVC Ratio: 100 %
RV % pred: 108 %
RV: 2.53 L
TLC % pred: 79 %
TLC: 4.1 L

## 2023-09-03 NOTE — Patient Instructions (Signed)
 Full PFT Performed Today

## 2023-09-03 NOTE — Progress Notes (Signed)
 Full PFT Performed Today

## 2023-09-21 ENCOUNTER — Ambulatory Visit: Payer: Medicare Other | Admitting: Pulmonary Disease

## 2023-09-23 DIAGNOSIS — R5383 Other fatigue: Secondary | ICD-10-CM | POA: Diagnosis not present

## 2023-09-23 DIAGNOSIS — M0589 Other rheumatoid arthritis with rheumatoid factor of multiple sites: Secondary | ICD-10-CM | POA: Diagnosis not present

## 2023-09-23 DIAGNOSIS — Z111 Encounter for screening for respiratory tuberculosis: Secondary | ICD-10-CM | POA: Diagnosis not present

## 2023-09-23 DIAGNOSIS — Z79899 Other long term (current) drug therapy: Secondary | ICD-10-CM | POA: Diagnosis not present

## 2023-10-11 ENCOUNTER — Encounter: Payer: Self-pay | Admitting: Pulmonary Disease

## 2023-10-11 ENCOUNTER — Ambulatory Visit (INDEPENDENT_AMBULATORY_CARE_PROVIDER_SITE_OTHER): Payer: Medicare Other | Admitting: Pulmonary Disease

## 2023-10-11 VITALS — BP 173/93 | HR 57 | Ht 66.0 in | Wt 154.2 lb

## 2023-10-11 DIAGNOSIS — R0689 Other abnormalities of breathing: Secondary | ICD-10-CM

## 2023-10-11 DIAGNOSIS — R0600 Dyspnea, unspecified: Secondary | ICD-10-CM | POA: Diagnosis not present

## 2023-10-11 DIAGNOSIS — Z87891 Personal history of nicotine dependence: Secondary | ICD-10-CM | POA: Diagnosis not present

## 2023-10-11 DIAGNOSIS — R911 Solitary pulmonary nodule: Secondary | ICD-10-CM | POA: Diagnosis not present

## 2023-10-11 NOTE — Progress Notes (Signed)
 Karen Delgado    [id number]    09-29-1945  Primary Care Physician:Ross, Laretta Pleasure, MD  Referring Physician: Jimmey Mould, MD [address] [place],  [place] [address]  Chief complaint:    HPI: 78 y.o. who  has a past medical history of Hypertension, Rheumatoid arthritis (HCC), and Stroke [facility]).  Discussed the use of AI scribe software for clinical note transcription with the patient, who gave verbal consent to proceed.  Karen Delgado, a retired Paramedic, presents with intermittent shortness of breath that occurs without any identifiable triggers. The episodes are brief and are associated with a sensation of pressure in the chest and a dry cough. She initially thought it was related to her heart, but cardiac tests were normal.  She has a history of rheumatoid arthritis, diagnosed around 2005, for which she is currently treated with Simponi  and Leflunomide. She was previously on Methotrexate. Her arthritis symptoms are well controlled on the current regimen.  She follows with Dr. Stefan Edge for this.    She also has a history of hypertension and a stroke in 2011. She quit smoking that same year, after a 15-20 year habit of half a pack a day. She has no known family history of lung disease. She has been using a feather pillow for a long time.  She also reports a history of a heart murmur, diagnosed by her primary care physician, Dr. Avanell Bob. She has not had any further testing for this.  She recently had a cardiac CT which showed some groundglass opacities and bronchial inflammation which will need further follow-up.  Rheumatology and primary care records reviewed.  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  She has feather pillows for many years Smoking history: 10-pack-year smoker.  Quit in 2011 Travel history: Originally from [place]  Relevant family history: No significant family history of lung disease  Interim  history:  History of Present Illness Karen Delgado is a 78 year old female with rheumatoid arthritis who presents with shortness of breath.  She experiences occasional shortness of breath. A CT scan and lung function tests were performed to evaluate this symptom. The CT scan did not show any interstitial lung disease related to her rheumatoid arthritis, but it did reveal two small lung nodules. Her lung function tests indicated normal lung capacity, with improvement noted after using an inhaler.  She has a history of rheumatoid arthritis, which is currently under control with leflunomide and Simponi  injections. Recent lab tests showed antibodies consistent with rheumatoid arthritis, but otherwise, her labs were normal.  She has a history of smoking but quit many years ago. There is no current indication of lung disease related to her past smoking history.     Outpatient Encounter Medications as of 10/11/2023  Medication Sig   ferrous sulfate 325 (65 FE) MG tablet Take 325 mg by mouth daily.   Glucosamine 500 MG CAPS Takes half   golimumab  (SIMPONI  ARIA) 50 MG/4ML SOLN injection Inject 50 mg into the vein once.   leflunomide (ARAVA) 10 MG tablet Take 10 mg by mouth daily.   losartan (COZAAR) 25 MG tablet Take 25 mg by mouth daily.   Multiple Vitamins-Minerals (CENTRUM SILVER ULTRA WOMENS) TABS Orally   omeprazole (PRILOSEC) 20 MG capsule Take 20 mg by mouth daily.   [DISCONTINUED] mupirocin  ointment (BACTROBAN ) 2 % Apply 1 application topically 2 (two) times daily.   [DISCONTINUED] ondansetron  (ZOFRAN ) 4 MG  tablet Take 1 tablet (4 mg total) by mouth every 8 (eight) hours as needed for nausea or vomiting.   No facility-administered encounter medications on file as of 10/11/2023.    Physical Exam: Blood pressure 126/74, pulse 78, temperature 97.8 F (36.6 C), temperature source Oral, height 5\' 7"  (1.702 m), weight 157 lb 9.6 oz (71.5 kg), SpO2 98%. Gen:      No acute distress HEENT:  EOMI,  sclera anicteric Neck:     No masses; no thyromegaly Lungs:    Clear to auscultation bilaterally; normal respiratory effort CV:         Regular rate and rhythm; no murmurs Abd:      + bowel sounds; soft, non-tender; no palpable masses, no distension Ext:    No edema; adequate peripheral perfusion Skin:      Warm and dry; no rash Neuro: alert and oriented x 3 Psych: normal mood and affect  Data Reviewed: Imaging: Cardiac CT 05/17/2023-scattered areas of peribronchovascular groundglass close bronchial wall thickening  High-resolution CT 06/14/2023-no evidence of interstitial lung disease, subcentimeter pulmonary nodules measuring up to 7 mm, aortic atherosclerosis, enlarged pulmonary trunk I have reviewed the images personally.  PFTs: 09/03/2023 FVC 1.52 [53%), FEV1 1.41 [66%], F/F93, TLC 4.10 [R 9%], DLCO 21.08 [108%] Bronchodilator response, elevated diffusion capacity  Labs: CBC 06/04/2023-WBC 4.5, eos 4.4%, absolute eosinophil count 198 IgE 06/04/2019 24-48 CTD serologies 06/04/2023-ANA 1: 320, nuclear speckled, CCP greater than 250, rheumatoid factor 91 Assessment & Plan Shortness of breath, asthma Intermittent shortness of breath. CT scan showed no interstitial lung disease related to rheumatoid arthritis, but two small lung nodules were noted. Lung function tests were normal with a bronchodilator response, suggesting possible asthma. She is currently asymptomatic. Informed consent was obtained regarding potential inhaler use if symptoms worsen. - Monitor symptoms and consider inhaler use if shortness of breath worsens - Follow-up CT scan scheduled for June to monitor lung nodules  Rheumatoid arthritis Rheumatoid arthritis is well-controlled with leflunomide and Simponi . Lab tests showed antibodies consistent with rheumatoid arthritis. No evidence of interstitial lung disease related to rheumatoid arthritis on CT scan. - Continue current medications: leflunomide and Simponi  -  Follow up with rheumatologist, Dr. Dannie Duval  Heart murmur Heart murmur present. Echocardiogram was considered but deferred. She prefers to follow up with primary care for further evaluation. Informed consent was obtained regarding this decision. - Patient will discuss heart murmur with primary care provider for further evaluation  Recommendations: Follow-up CT scan for lung nodules  Phyllis Breeze MD [facility] 10/11/2023, 10:40 AM  CC: Jimmey Mould, MD

## 2023-10-11 NOTE — Patient Instructions (Signed)
 VISIT SUMMARY:  Today, we discussed your occasional shortness of breath and reviewed the results of your recent CT scan and lung function tests. We also reviewed your rheumatoid arthritis management and discussed a heart murmur that was noted during your visit.  YOUR PLAN:  -SHORTNESS OF BREATH: Your CT scan did not show any lung disease related to your rheumatoid arthritis, but it did reveal two small lung nodules. Your lung function tests were normal, and you showed improvement with an inhaler, suggesting possible asthma. We will monitor your symptoms and consider using an inhaler if your shortness of breath worsens. A follow-up CT scan is scheduled for June to check on the lung nodules.  -RHEUMATOID ARTHRITIS: Your rheumatoid arthritis is well-controlled with your current medications, leflunomide and Simponi. Recent lab tests confirmed the presence of antibodies consistent with rheumatoid arthritis, but there is no evidence of lung disease related to it. Continue your current medications and follow up with your rheumatologist, Dr. Orlin Hilding, as needed.  -HEART MURMUR: A heart murmur was noted during your visit. While an echocardiogram was considered, you preferred to follow up with your primary care provider for further evaluation. Please discuss this with your primary care provider at your next visit.  INSTRUCTIONS:  Please monitor your symptoms and consider using an inhaler if your shortness of breath worsens. A follow-up CT scan is scheduled for June to monitor the lung nodules. Continue your current medications for rheumatoid arthritis and follow up with your rheumatologist as needed. Discuss the heart murmur with your primary care provider for further evaluation.

## 2023-11-01 DIAGNOSIS — Z6825 Body mass index (BMI) 25.0-25.9, adult: Secondary | ICD-10-CM | POA: Diagnosis not present

## 2023-11-01 DIAGNOSIS — M1991 Primary osteoarthritis, unspecified site: Secondary | ICD-10-CM | POA: Diagnosis not present

## 2023-11-01 DIAGNOSIS — M0589 Other rheumatoid arthritis with rheumatoid factor of multiple sites: Secondary | ICD-10-CM | POA: Diagnosis not present

## 2023-11-01 DIAGNOSIS — Z79899 Other long term (current) drug therapy: Secondary | ICD-10-CM | POA: Diagnosis not present

## 2023-11-01 DIAGNOSIS — E663 Overweight: Secondary | ICD-10-CM | POA: Diagnosis not present

## 2023-11-22 DIAGNOSIS — M1711 Unilateral primary osteoarthritis, right knee: Secondary | ICD-10-CM | POA: Diagnosis not present

## 2023-11-23 DIAGNOSIS — Z79899 Other long term (current) drug therapy: Secondary | ICD-10-CM | POA: Diagnosis not present

## 2023-11-23 DIAGNOSIS — M0589 Other rheumatoid arthritis with rheumatoid factor of multiple sites: Secondary | ICD-10-CM | POA: Diagnosis not present

## 2023-11-29 DIAGNOSIS — M1711 Unilateral primary osteoarthritis, right knee: Secondary | ICD-10-CM | POA: Diagnosis not present

## 2023-12-08 DIAGNOSIS — M1711 Unilateral primary osteoarthritis, right knee: Secondary | ICD-10-CM | POA: Diagnosis not present

## 2023-12-13 ENCOUNTER — Ambulatory Visit
Admission: RE | Admit: 2023-12-13 | Discharge: 2023-12-13 | Disposition: A | Discharge status: Home / Self Care | Payer: Medicare Other | Source: Ambulatory Visit | Attending: Pulmonary Disease | Admitting: Pulmonary Disease

## 2023-12-13 ENCOUNTER — Other Ambulatory Visit: Payer: Medicare Other

## 2023-12-13 DIAGNOSIS — R918 Other nonspecific abnormal finding of lung field: Secondary | ICD-10-CM | POA: Diagnosis not present

## 2023-12-13 DIAGNOSIS — R911 Solitary pulmonary nodule: Secondary | ICD-10-CM

## 2023-12-27 ENCOUNTER — Ambulatory Visit: Payer: Self-pay | Admitting: Pulmonary Disease

## 2023-12-27 DIAGNOSIS — R911 Solitary pulmonary nodule: Secondary | ICD-10-CM

## 2024-01-18 DIAGNOSIS — M0589 Other rheumatoid arthritis with rheumatoid factor of multiple sites: Secondary | ICD-10-CM | POA: Diagnosis not present

## 2024-01-24 DIAGNOSIS — K219 Gastro-esophageal reflux disease without esophagitis: Secondary | ICD-10-CM | POA: Diagnosis not present

## 2024-01-24 DIAGNOSIS — Z7982 Long term (current) use of aspirin: Secondary | ICD-10-CM | POA: Diagnosis not present

## 2024-01-24 DIAGNOSIS — Z860100 Personal history of colon polyps, unspecified: Secondary | ICD-10-CM | POA: Diagnosis not present

## 2024-02-21 DIAGNOSIS — Z09 Encounter for follow-up examination after completed treatment for conditions other than malignant neoplasm: Secondary | ICD-10-CM | POA: Diagnosis not present

## 2024-02-21 DIAGNOSIS — Z860101 Personal history of adenomatous and serrated colon polyps: Secondary | ICD-10-CM | POA: Diagnosis not present

## 2024-02-22 ENCOUNTER — Emergency Department (HOSPITAL_BASED_OUTPATIENT_CLINIC_OR_DEPARTMENT_OTHER)

## 2024-02-22 ENCOUNTER — Emergency Department (HOSPITAL_BASED_OUTPATIENT_CLINIC_OR_DEPARTMENT_OTHER)
Admission: EM | Admit: 2024-02-22 | Discharge: 2024-02-22 | Discharge status: Against Medical Advice | Attending: Emergency Medicine | Admitting: Emergency Medicine

## 2024-02-22 ENCOUNTER — Encounter (HOSPITAL_BASED_OUTPATIENT_CLINIC_OR_DEPARTMENT_OTHER): Payer: Self-pay

## 2024-02-22 ENCOUNTER — Other Ambulatory Visit: Payer: Self-pay

## 2024-02-22 DIAGNOSIS — Z5329 Procedure and treatment not carried out because of patient's decision for other reasons: Secondary | ICD-10-CM | POA: Insufficient documentation

## 2024-02-22 DIAGNOSIS — R200 Anesthesia of skin: Secondary | ICD-10-CM

## 2024-02-22 DIAGNOSIS — Z8673 Personal history of transient ischemic attack (TIA), and cerebral infarction without residual deficits: Secondary | ICD-10-CM | POA: Insufficient documentation

## 2024-02-22 DIAGNOSIS — I10 Essential (primary) hypertension: Secondary | ICD-10-CM | POA: Insufficient documentation

## 2024-02-22 DIAGNOSIS — K068 Other specified disorders of gingiva and edentulous alveolar ridge: Secondary | ICD-10-CM | POA: Diagnosis not present

## 2024-02-22 DIAGNOSIS — R202 Paresthesia of skin: Secondary | ICD-10-CM | POA: Diagnosis not present

## 2024-02-22 DIAGNOSIS — R9082 White matter disease, unspecified: Secondary | ICD-10-CM | POA: Diagnosis not present

## 2024-02-22 DIAGNOSIS — R29818 Other symptoms and signs involving the nervous system: Secondary | ICD-10-CM | POA: Diagnosis not present

## 2024-02-22 DIAGNOSIS — Z79899 Other long term (current) drug therapy: Secondary | ICD-10-CM | POA: Diagnosis not present

## 2024-02-22 LAB — CBG MONITORING, ED: Glucose-Capillary: 73 mg/dL (ref 70–99)

## 2024-02-22 NOTE — Discharge Instructions (Signed)
 It was a pleasure taking care of you today. As discussed, it was recommended you get labs drawn; however you declined. It was discussed that I am unable to rule out a stroke without MRI. Please follow-up with your neurologist this week for further evaluation. Return to the ER for new or worsening symptoms.

## 2024-02-22 NOTE — ED Notes (Signed)
 Pt requested to see Dr before she has blood work done. Pt doesn't feel blood work is necessary at this time. Pt also states she gets blood drawn all the time and really does not want to be stuck if it can be helped.

## 2024-02-22 NOTE — ED Notes (Signed)
 Pt states she has a bump on her gum on right side. Pt states her gum feels weird as well. Noticed bump started about 1 week ago. Numbness around lip started around 1 pm today.

## 2024-02-22 NOTE — ED Triage Notes (Signed)
 Patient reports facial numbness starting one hour ago on the right side. She denies any weakness on the right side and is able to ambulate and move all extremities. She denies any vision changes. Only sensation change is on right face. Some deviation when she sticks her tongue out, and some difficulty smiling. No slurred speech.

## 2024-02-22 NOTE — ED Provider Notes (Signed)
 [place] EMERGENCY DEPARTMENT AT [facility] Provider Note   CSN: [id number] Arrival date & time: 02/22/24  1358     Patient presents with: Numbness (Facial)   Karen Delgado is a 78 y.o. female with a past medical history significant for RA, history of CVA, and hypertension who presents to the ED due to numbness/tingling around right side of face.  Numbness/tingling started around 1 PM today.  Previous CVA.  Denies any speech or visual changes.  No unilateral weakness.  Denies headache.  No recent head injury.  Not on any blood thinners.  Called to triage to assess patient due to some tongue deviation and difficulty swallowing per RN. Code stroke activated in triage. Upon further questioning patient notes she has a sore on her gum that appeared 1 week ago.   History obtained from patient and past medical records. No interpreter used during encounter.       Prior to Admission medications   Medication Sig Start Date End Date Taking? Authorizing Provider  ferrous sulfate 325 (65 FE) MG tablet Take 325 mg by mouth daily.    [provider]  Glucosamine 500 MG CAPS Takes half    [provider]  golimumab  (SIMPONI  ARIA) 50 MG/4ML SOLN injection Inject 50 mg into the vein once. 03/26/15   [provider]  leflunomide (ARAVA) 10 MG tablet Take 10 mg by mouth daily.    [provider]  losartan (COZAAR) 25 MG tablet Take 25 mg by mouth daily.    [provider]  Multiple Vitamins-Minerals (CENTRUM SILVER ULTRA WOMENS) TABS Orally    [provider]  omeprazole (PRILOSEC) 20 MG capsule Take 20 mg by mouth daily.    [provider]    Allergies: Aspirin and Tomato    Review of Systems  Constitutional:  Negative for fever.  Eyes:  Negative for visual disturbance.  Respiratory:  Negative for shortness of breath.   Cardiovascular:  Negative for chest pain.  Neurological:  Positive for numbness. Negative for speech  difficulty, weakness and headaches.    Updated Vital Signs BP (!) 169/105   Pulse 66   Temp 98.6 F (37 C)   Resp 13   SpO2 98%   Physical Exam Vitals and nursing note reviewed.  Constitutional:      General: She is not in acute distress.    Appearance: She is not ill-appearing.  HENT:     Head: Normocephalic.     Mouth/Throat:     Comments: Serous lesion on right upper gum. No abscess.  Eyes:     Pupils: Pupils are equal, round, and reactive to light.  Cardiovascular:     Rate and Rhythm: Normal rate and regular rhythm.     Pulses: Normal pulses.     Heart sounds: Normal heart sounds. No murmur heard.    No friction rub. No gallop.  Pulmonary:     Effort: Pulmonary effort is normal.     Breath sounds: Normal breath sounds.  Abdominal:     General: Abdomen is flat. There is no distension.     Palpations: Abdomen is soft.     Tenderness: There is no abdominal tenderness. There is no guarding or rebound.  Musculoskeletal:        General: Normal range of motion.     Cervical back: Neck supple.  Skin:    General: Skin is warm and dry.  Neurological:     General: No focal deficit present.  Mental Status: She is alert.     Comments: Speech is clear, able to follow commands CN III-XII intact Normal strength in upper and lower extremities bilaterally including dorsiflexion and plantar flexion, strong and equal grip strength Sensation grossly intact throughout Moves extremities without ataxia, coordination intact No pronator drift Ambulates without difficulty  Psychiatric:        Mood and Affect: Mood normal.        Behavior: Behavior normal.     (all labs ordered are listed, but only abnormal results are displayed) Labs Reviewed  ETHANOL  PROTIME-INR  APTT  CBC  DIFFERENTIAL  COMPREHENSIVE METABOLIC PANEL WITH GFR  URINE DRUG SCREEN  CBG MONITORING, ED    EKG: EKG Interpretation Date/Time:  Tuesday February 22 2024 14:43:06 EDT Ventricular Rate:  58 PR  Interval:  164 QRS Duration:  92 QT Interval:  416 QTC Calculation: 409 R Axis:   47  Text Interpretation: Sinus rhythm Probable left atrial enlargement Probable LVH with secondary repol abnrm No significant change since last tracing Confirmed by Randol Simmonds [id number]) on 02/22/2024 3:36:23 PM  Radiology: CT HEAD CODE STROKE WO CONTRAST` Result Date: 02/22/2024 EXAM: CT HEAD WITHOUT 02/22/2024 02:24:47 PM TECHNIQUE: CT of the head was performed without the administration of intravenous contrast. Automated exposure control, iterative reconstruction, and/or weight based adjustment of the mA/kV was utilized to reduce the radiation dose to as low as reasonably achievable. COMPARISON: CT of the head dated 04/27/2009. CLINICAL HISTORY: Neuro deficit, acute, stroke suspected; Code stroke. LKW- 1 hr ago ; Hx of stroke ; Dr Randol [phone] FINDINGS: BRAIN AND VENTRICLES: No acute intracranial hemorrhage. No mass effect or midline shift. No extra-axial fluid collection. Gray-white differentiation is maintained. No hydrocephalus. Age-related cerebral volume loss present. Mild-to-moderate diffuse cerebral white matter disease present. ORBITS: No acute abnormality. SINUSES AND MASTOIDS: No acute abnormality. SOFT TISSUES AND SKULL: No acute skull fracture. No acute soft tissue abnormality. VASCULATURE: Moderate vascular calcifications. [place] stroke program early CT (ASPECT) score: Ganglionic (caudate, IC, lentiform nucleus, insula, M1-M3): 7 Supraganglionic (M4-M6): 3 Total: 10 The above findings were discussed with Dr. Randol at 02:28 PM on 02/22/2024. IMPRESSION: 1. No acute intracranial abnormality. 2. Age-related cerebral volume loss and mild-to-moderate diffuse cerebral white matter disease. 3. Moderate vascular calcifications. 4. ASPECT score: 10. Electronically signed by: evalene berrigan 02/22/2024 02:31 PM EDT RP Workstation: [id number]     Procedures   Medications Ordered in the ED - No data to  display  Clinical Course as of 02/22/24 1820  Tue Feb 22, 2024  1420 Code stroke activated in triage [CA]  1502 Reassessed patient at bedside. Patient still having some numbness around mouth.  [CA]  1514 Reassessed patient at bedside with daughter in room.  Patient is requesting to leave.  Declined any labs.  I did have a long discussion with patient and daughter at bedside that I am unable to rule out a CVA without MRI.  Patient does not want to be transferred to [facility] to receive MRI.  Patient also declined any lab work and notes that she gets labs frequently and they are typically reassuring.  Patient made aware that I am unable to explain her perioral paresthesias however, could possibly be related to sore on gum. She is aware that I am unable to rule out CVA or electrolyte abnormalities without further work-up. [CA]    Clinical Course User Index [CA] Lorelle Aleck BROCKS, PA-C  Medical Decision Making Amount and/or Complexity of Data Reviewed Independent Historian: caregiver    Details: Daughter at bedside provided some history External Data Reviewed: notes. Labs: ordered. Decision-making details documented in ED Course. Radiology: ordered and independent interpretation performed. Decision-making details documented in ED Course.   This patient presents to the ED for concern of facial numbness, this involves an extensive number of treatment options, and is a complaint that carries with it a high risk of complications and morbidity.  The differential diagnosis includes CVA, shingles, vitamin deficiency, electrolyte derangement, etc  78 year old female presents to the ED due to right-sided facial numbness that started around 1 PM.  Previous history of CVA.  I was called to triage to assess patient.  Code stroke activated in triage.  Patient brought straight to CT scanner which was negative for any acute abnormalities.  Patient evaluated by neurology. Upon  further questioning patient notes she developed a lesion to her upper gum 1 week ago. CBG normal.  Patient declined labs.  See note above.  Patient also does not want to be transferred for MRI to rule out CVA.  Numbness/tingling could possibly be related to oral lesion however, patient made aware that I am unable to rule out CVA without MRI. Discussed with Dr. Randol who evaluated patient at bedside and agrees with assessment and plan.   Co morbidities that complicate the patient evaluation  Hx CVA Cardiac Monitoring: / EKG:  The patient was maintained on a cardiac monitor.  I personally viewed and interpreted the cardiac monitored which showed an underlying rhythm of: NSR  Social Determinants of Health:  Elderly >65  Test / Admission - Considered:  Patient declined transport for MRI.      Final diagnoses:  Facial numbness    ED Discharge Orders     None          Lorelle Aleck JAYSON DEVONNA 02/22/24 1820    Randol Simmonds, MD 02/23/24 [phone]

## 2024-02-22 NOTE — Consult Note (Signed)
 [facility] Telemedicine Consult   Requesting Provider: Thom Fetters Consult Participants: Myself, bedside nurse, patient  Location of the provider: [facility] hospital Location of the patient: [facility] ED  This consult was provided via telemedicine with 2-way video and audio communication. The patient/family was informed that care would be provided in this way and agreed to receive care in this manner.    Chief Complaint: Right facial numbness   HPI: This is a 78 year old woman with a past medical history significant for rheumatoid arthritis on leflunomide  She reports she has been in her usual state of health other than receiving a routine screening colonoscopy yesterday which she reports was unremarkable.  She was sleepy after the procedure though and therefore did not wake up until 10:30 AM.  Felt fine on initial waking but while making her coffee at 1 PM noticed that the right upper lip felt numb.  Denies any other symptoms including headache, focal neurological symptoms, fevers, discomfort, other than noting she has been having some issues with her gum and wonders if this might be related to the numbness as the gum issues are on the same side  LKW: 10:30 AM Thrombolytic given?: No, symptoms unlikely due to to stroke, more likely due to peripheral nerve issue IR Thrombectomy? No, exam not consistent with large vessel occlusion Modified Rankin Scale: 0 Time of teleneurologist evaluation:   Exam: Vitals:   02/22/24 1403  BP: (!) 160/86  Pulse: 66  Resp: 18  Temp: 98 F (36.7 C)  SpO2: 95%    General: No acute distress Pulmonary: breathing comfortably   NIH Stroke scale 1A: Level of Consciousness - 0 1B: Ask Month and Age - 0 1C: 'Blink Eyes' & 'Squeeze Hands' - 0 2: Test Horizontal Extraocular Movements - 0 3: Test Visual Fields - 0 4: Test Facial Palsy - 0 5A: Test Left Arm Motor Drift - 0 5B: Test Right Arm Motor Drift - 0 6A: Test Left Leg Motor  Drift - 0 6B: Test Right Leg Motor Drift - 0 7: Test Limb Ataxia - 0 8: Test Sensation - 0 9: Test Language/Aphasia- 0 10: Test Dysarthria - 0 11: Test Extinction/Inattention - 0 NIHSS score: 0   Imaging Reviewed:   Head CT reviewed, no acute intracranial process 1. No acute intracranial abnormality. 2. Age-related cerebral volume loss and mild-to-moderate diffuse cerebral white matter disease. 3. Moderate vascular calcifications. 4. ASPECT score: 10.  Labs reviewed in epic and pertinent values follow:  Basic Metabolic Panel: No results for input(s): NA, K, CL, CO2, GLUCOSE, BUN, CREATININE, CALCIUM, MG, PHOS in the last 168 hours.  CBC: No results for input(s): WBC, NEUTROABS, HGB, HCT, MCV, PLT in the last 168 hours.  Coagulation Studies: No results for input(s): LABPROT, INR in the last 72 hours.    Assessment: History seems more consistent with involvement of the small branch of the infraorbital nerve rather than acute stroke.  Additionally symptoms of minor upper lip numbness would not be disabling even if this were to be a stroke, therefore risk/benefit criteria for TNK are not met  Recommendations:  - Oral/gum examination per ED provider, if she is felt to have oral findings that explain her numbness no further workup is needed - MRI brain with and without contrast to confirm no acute intracranial process if physical examination is unremarkable - If MRI brain is negative outpatient follow-up is appropriate with return precautions - Appreciate medical clearance per ED  Consult level time thresholds, 20 min (  1), 40 min (2), 55 min (3), 80 min (4), 110 min (5)  Lola Jernigan MD-PhD [facility] [phone]   If 8pm-8am, please page neurology on call as listed in AMION.  CRITICAL CARE Performed by: Lola LITTIE Jernigan   Total critical care time: 35 minutes  Critical care time was exclusive of separately billable  procedures and treating other patients.  Critical care was necessary to treat or prevent imminent or life-threatening deterioration, emergent evaluation for consideration of thrombolytic or thrombectomy.  Critical care was time spent personally by me on the following activities: development of treatment plan with patient and/or surrogate as well as nursing, discussions with consultants, evaluation of patient's response to treatment, examination of patient, obtaining history from patient or surrogate, ordering and performing treatments and interventions, ordering and review of laboratory studies, ordering and review of radiographic studies, pulse oximetry and re-evaluation of patient's condition.

## 2024-02-22 NOTE — ED Provider Notes (Signed)
 I provided a substantive portion of the care of this patient.  I personally made/approved the management plan for this patient and take responsibility for the patient management.       Randol Simmonds, MD 02/23/24 [phone]

## 2024-03-14 DIAGNOSIS — M0589 Other rheumatoid arthritis with rheumatoid factor of multiple sites: Secondary | ICD-10-CM | POA: Diagnosis not present

## 2024-05-01 ENCOUNTER — Other Ambulatory Visit: Payer: Self-pay | Admitting: Family Medicine

## 2024-05-01 DIAGNOSIS — Z1231 Encounter for screening mammogram for malignant neoplasm of breast: Secondary | ICD-10-CM

## 2024-05-09 DIAGNOSIS — E663 Overweight: Secondary | ICD-10-CM | POA: Diagnosis not present

## 2024-05-09 DIAGNOSIS — M1991 Primary osteoarthritis, unspecified site: Secondary | ICD-10-CM | POA: Diagnosis not present

## 2024-05-09 DIAGNOSIS — Z6825 Body mass index (BMI) 25.0-25.9, adult: Secondary | ICD-10-CM | POA: Diagnosis not present

## 2024-05-09 DIAGNOSIS — Z79899 Other long term (current) drug therapy: Secondary | ICD-10-CM | POA: Diagnosis not present

## 2024-05-09 DIAGNOSIS — M0589 Other rheumatoid arthritis with rheumatoid factor of multiple sites: Secondary | ICD-10-CM | POA: Diagnosis not present

## 2024-05-16 DIAGNOSIS — M0589 Other rheumatoid arthritis with rheumatoid factor of multiple sites: Secondary | ICD-10-CM | POA: Diagnosis not present

## 2024-05-18 DIAGNOSIS — I10 Essential (primary) hypertension: Secondary | ICD-10-CM | POA: Diagnosis not present

## 2024-05-18 DIAGNOSIS — Z23 Encounter for immunization: Secondary | ICD-10-CM | POA: Diagnosis not present

## 2024-05-18 DIAGNOSIS — E7800 Pure hypercholesterolemia, unspecified: Secondary | ICD-10-CM | POA: Diagnosis not present

## 2024-05-18 DIAGNOSIS — K219 Gastro-esophageal reflux disease without esophagitis: Secondary | ICD-10-CM | POA: Diagnosis not present

## 2024-05-18 DIAGNOSIS — Z0000 Encounter for general adult medical examination without abnormal findings: Secondary | ICD-10-CM | POA: Diagnosis not present

## 2024-05-18 DIAGNOSIS — Z6825 Body mass index (BMI) 25.0-25.9, adult: Secondary | ICD-10-CM | POA: Diagnosis not present

## 2024-05-30 ENCOUNTER — Other Ambulatory Visit (HOSPITAL_COMMUNITY): Payer: Self-pay

## 2024-05-30 ENCOUNTER — Ambulatory Visit
Admission: RE | Admit: 2024-05-30 | Discharge: 2024-05-30 | Disposition: A | Discharge status: Home / Self Care | Source: Ambulatory Visit | Attending: Family Medicine | Admitting: Family Medicine

## 2024-05-30 DIAGNOSIS — Z1231 Encounter for screening mammogram for malignant neoplasm of breast: Secondary | ICD-10-CM

## 2024-05-30 NOTE — Progress Notes (Unsigned)
 Cardiology Office Note:  .   Date:  05/31/2024  ID:  Karen Delgado, DOB [birth year], MRN [id number] PCP: Okey Carlin Redbird, MD  [facility] Providers Cardiologist:  None { History of Present Illness: .    Chief Complaint  Patient presents with   Heart Murmur         Karen Delgado is a 77 y.o. female with history of HTN who presents for the evaluation of murmur at the request of Okey Carlin Redbird, MD.  T chol 185, TG 56, LDL 101, HDL 73   History of Present Illness   Karen Delgado is a 78 year old female with hypertension who presents for evaluation of a heart murmur. She was referred by Dr. Okey for evaluation of a heart murmur.  She experiences shortness of breath that can occur at rest and is not consistently triggered by physical activity. No chest pain, but she describes a sensation of fullness in her chest. She has a history of sinus bradycardia and left ventricular hypertrophy noted on an EKG performed on February 22, 2024, when she visited the emergency room for numbness. A coronary calcium score performed in November 2024 was zero.  Her hypertension is well-controlled with losartan 25 mg daily. She has a history of a stroke around October 2011, for which no specific cause was identified, though her blood pressure was high at the time. No history of diabetes.  Her family history is notable for heart disease in her mother. She is a former smoker and no longer consumes alcohol. She is retired, previously married, now single, with two children and two grandchildren. She engages in regular walking as a form of exercise.  Occasional swelling in her ankles. She has experienced shortness of breath and a feeling of weakness, as if she might faint, but does not report consistent triggers for these symptoms.         Problem List HTN Rheumatoid arthritis GERD CVA -2010 5. HLD -T chol  -CAC = 0 05/17/2023    ROS: All other ROS reviewed and negative. Pertinent positives  noted in the HPI.     Studies Reviewed: SABRA       EKG 02/22/2024  SB 58 bpm, LVH with repol Physical Exam:   VS:  BP 122/70   Pulse 76   Ht 5' 6 (1.676 m)   Wt 155 lb (70.3 kg)   SpO2 100%   BMI 25.02 kg/m    Wt Readings from Last 3 Encounters:  05/31/24 155 lb (70.3 kg)  10/11/23 154 lb 3.2 oz (69.9 kg)  09/03/23 152 lb (68.9 kg)    GEN: Well nourished, well developed in no acute distress NECK: No JVD; No carotid bruits CARDIAC: RRR, 3/6 HSM, rubs, gallops RESPIRATORY:  Clear to auscultation without rales, wheezing or rhonchi  ABDOMEN: Soft, non-tender, non-distended EXTREMITIES:  No edema; No deformity  ASSESSMENT AND PLAN: .   Assessment and Plan    Cardiac murmur with associated precordial chest discomfort and shortness of breath Prominent systolic murmur, 3/6, suggesting possible tricuspid or mitral regurgitation. Differential includes valvular heart disease. Symptoms could represent angina. - Ordered echocardiogram to evaluate heart valves and function. - Scheduled coronary CTA to assess for coronary artery blockages. - Ordered BMP.  - Administered metoprolol 100 mg two hours before coronary CTA to slow heart rate. - Will follow up based on test results.  Essential hypertension, well controlled on losartan Hypertension is well controlled with losartan 25 mg daily. -  Continue losartan 25 mg daily.              Follow-up: Return if symptoms worsen or fail to improve.  Signed, Darryle DASEN. Barbaraann, MD, [facility]  [facility]  [address] [place], [place] [address] [phone]  11:26 AM

## 2024-05-30 NOTE — H&P (View-Only) (Signed)
 Cardiology Office Note:  .   Date:  05/31/2024  ID:  Karen Delgado, DOB 06-Oct-1945, MRN [id number] PCP: Okey Carlin Redbird, MD  [facility] Providers Cardiologist:  None { History of Present Illness: .    Chief Complaint  Patient presents with   Heart Murmur         Karen Delgado is a 78 y.o. female with history of HTN who presents for the evaluation of murmur at the request of Okey Carlin Redbird, MD.  T chol 185, TG 56, LDL 101, HDL 73   History of Present Illness   Karen Delgado is a 78 year old female with hypertension who presents for evaluation of a heart murmur. She was referred by Dr. Okey for evaluation of a heart murmur.  She experiences shortness of breath that can occur at rest and is not consistently triggered by physical activity. No chest pain, but she describes a sensation of fullness in her chest. She has a history of sinus bradycardia and left ventricular hypertrophy noted on an EKG performed on February 22, 2024, when she visited the emergency room for numbness. A coronary calcium  score performed in November 2024 was zero.  Her hypertension is well-controlled with losartan  25 mg daily. She has a history of a stroke around October 2011, for which no specific cause was identified, though her blood pressure was high at the time. No history of diabetes.  Her family history is notable for heart disease in her mother. She is a former smoker and no longer consumes alcohol. She is retired, previously married, now single, with two children and two grandchildren. She engages in regular walking as a form of exercise.  Occasional swelling in her ankles. She has experienced shortness of breath and a feeling of weakness, as if she might faint, but does not report consistent triggers for these symptoms.         Problem List HTN Rheumatoid arthritis GERD CVA -2010 5. HLD -T chol  -CAC = 0 05/17/2023    ROS: All other ROS reviewed and negative. Pertinent positives  noted in the HPI.     Studies Reviewed: SABRA       EKG 02/22/2024  SB 58 bpm, LVH with repol Physical Exam:   VS:  BP 122/70   Pulse 76   Ht 5' 6 (1.676 m)   Wt 155 lb (70.3 kg)   SpO2 100%   BMI 25.02 kg/m    Wt Readings from Last 3 Encounters:  05/31/24 155 lb (70.3 kg)  10/11/23 154 lb 3.2 oz (69.9 kg)  09/03/23 152 lb (68.9 kg)    GEN: Well nourished, well developed in no acute distress NECK: No JVD; No carotid bruits CARDIAC: RRR, 3/6 HSM, rubs, gallops RESPIRATORY:  Clear to auscultation without rales, wheezing or rhonchi  ABDOMEN: Soft, non-tender, non-distended EXTREMITIES:  No edema; No deformity  ASSESSMENT AND PLAN: .   Assessment and Plan    Cardiac murmur with associated precordial chest discomfort and shortness of breath Prominent systolic murmur, 3/6, suggesting possible tricuspid or mitral regurgitation. Differential includes valvular heart disease. Symptoms could represent angina. - Ordered echocardiogram to evaluate heart valves and function. - Scheduled coronary CTA to assess for coronary artery blockages. - Ordered BMP.  - Administered metoprolol  100 mg two hours before coronary CTA to slow heart rate. - Will follow up based on test results.  Essential hypertension, well controlled on losartan  Hypertension is well controlled with losartan  25 mg daily. -  Continue losartan  25 mg daily.              Follow-up: Return if symptoms worsen or fail to improve.  Signed, Darryle DASEN. Barbaraann, MD, [facility]  [facility]  [address] [place], [place] [address] [phone]  11:26 AM

## 2024-05-31 ENCOUNTER — Ambulatory Visit: Attending: Cardiovascular Disease | Admitting: Cardiovascular Disease

## 2024-05-31 ENCOUNTER — Encounter: Payer: Self-pay | Admitting: Cardiovascular Disease

## 2024-05-31 VITALS — BP 122/70 | HR 76 | Ht 66.0 in | Wt 155.0 lb

## 2024-05-31 DIAGNOSIS — R0602 Shortness of breath: Secondary | ICD-10-CM | POA: Diagnosis not present

## 2024-05-31 DIAGNOSIS — R072 Precordial pain: Secondary | ICD-10-CM | POA: Diagnosis not present

## 2024-05-31 DIAGNOSIS — R011 Cardiac murmur, unspecified: Secondary | ICD-10-CM | POA: Insufficient documentation

## 2024-05-31 LAB — BASIC METABOLIC PANEL WITH GFR
BUN/Creatinine Ratio: 15 (ref 12–28)
BUN: 13 mg/dL (ref 8–27)
CO2: 26 mmol/L (ref 20–29)
Calcium: 9.7 mg/dL (ref 8.7–10.3)
Chloride: 100 mmol/L (ref 96–106)
Creatinine, Ser: 0.86 mg/dL (ref 0.57–1.00)
Glucose: 83 mg/dL (ref 70–99)
Potassium: 4.5 mmol/L (ref 3.5–5.2)
Sodium: 140 mmol/L (ref 134–144)
eGFR: 70 mL/min/1.73 (ref >59)

## 2024-05-31 MED ORDER — METOPROLOL TARTRATE 100 MG PO TABS: ORAL_TABLET | ORAL | 0 refills | Status: DC

## 2024-05-31 NOTE — Patient Instructions (Signed)
 Medication Instructions:  Your physician recommends that you continue on your current medications as directed. Please refer to the Current Medication list given to you today.  *If you need a refill on your cardiac medications before your next appointment, please call your pharmacy*  Lab Work: BMET today  Testing/Procedures: Your physician has requested that you have an echocardiogram. Echocardiography is a painless test that uses sound waves to create images of your heart. It provides your doctor with information about the size and shape of your heart and how well your heart's chambers and valves are working. This procedure takes approximately one hour. There are no restrictions for this procedure. Please do NOT wear cologne, perfume, aftershave, or lotions (deodorant is allowed). Please arrive 15 minutes prior to your appointment time.  Please note: We ask at that you not bring children with you during ultrasound (echo/ vascular) testing. Due to room size and safety concerns, children are not allowed in the ultrasound rooms during exams. Our front office staff cannot provide observation of children in our lobby area while testing is being conducted. An adult accompanying a patient to their appointment will only be allowed in the ultrasound room at the discretion of the ultrasound technician under special circumstances. We apologize for any inconvenience.    Your cardiac CT will be scheduled at one of the below locations:   [facility] [address]  [place], [place] [address]  If scheduled at the Heart and Vascular Tower at [address], please enter the parking lot using the [address] entrance and use the FREE valet service at the patient drop-off area. Enter the building and check-in with registration on the main floor.  IPlease follow these instructions carefully (unless otherwise directed):  An IV will be required for this test and Nitroglycerin  will be given.  Hold all erectile dysfunction medications at least 3 days (72 hrs) prior to test. (Ie viagra, cialis, sildenafil, tadalafil, etc)   On the Night Before the Test: Be sure to Drink plenty of water. Do not consume any caffeinated/decaffeinated beverages or chocolate 12 hours prior to your test. Do not take any antihistamines 12 hours prior to your test.  If the patient has contrast allergy: Patient will need a prescription for Prednisone and very clear instructions (as follows): Prednisone 50 mg - take 13 hours prior to test Take another Prednisone 50 mg 7 hours prior to test Take another Prednisone 50 mg 1 hour prior to test Take Benadryl 50 mg 1 hour prior to test Patient must complete all four doses of above prophylactic medications. Patient will need a ride after test due to Benadryl.  On the Day of the Test: Drink plenty of water until 1 hour prior to the test. Do not eat any food 1 hour prior to test. You may take your regular medications prior to the test.  Take metoprolol  (Lopressor ) two hours prior to test. If you take Furosemide/Hydrochlorothiazide/Spironolactone/Chlorthalidone, please HOLD on the morning of the test. Patients who wear a continuous glucose monitor MUST remove the device prior to scanning. FEMALES- please wear underwire-free bra if available, avoid dresses & tight clothing  *For Clinical Staff only. Please instruct patient the following:* Heart Rate Medication Recommendations for Cardiac CT  Resting HR < 50 bpm  No medication  Resting HR 50-60 bpm and BP >110/50 mmHG   Consider Metoprolol  tartrate 25 mg PO 90-120 min prior to scan  Resting HR 60-65 bpm and BP >110/50 mmHG  Metoprolol   tartrate 50 mg PO 90-120 minutes prior to scan   Resting HR > 65 bpm and BP >110/50 mmHG  Metoprolol  tartrate 100 mg PO 90-120 minutes prior to scan  Consider Ivabradine 10-15 mg PO or a calcium channel blocker for resting HR >60 bpm and contraindication to  metoprolol  tartrate  Consider Ivabradine 10-15 mg PO in combination with metoprolol  tartrate for HR >80 bpm        After the Test: Drink plenty of water. After receiving IV contrast, you may experience a mild flushed feeling. This is normal. On occasion, you may experience a mild rash up to 24 hours after the test. This is not dangerous. If this occurs, you can take Benadryl 25 mg, Zyrtec, Claritin, or Allegra and increase your fluid intake. (Patients taking Tikosyn should avoid Benadryl, and may take Zyrtec, Claritin, or Allegra) If you experience trouble breathing, this can be serious. If it is severe call 911 IMMEDIATELY. If it is mild, please call our office.  We will call to schedule your test 2-4 weeks out understanding that some insurance companies will need an authorization prior to the service being performed.   For more information and frequently asked questions, please visit our website : [email or web address]  For non-scheduling related questions, please contact the cardiac imaging nurse navigator should you have any questions/concerns: Cardiac Imaging Nurse Navigators Direct Office Dial: [phone]   For scheduling needs, including cancellations and rescheduling, please call Brittany, [phone].    Follow-Up: At [facility], you and your health needs are our priority.  As part of our continuing mission to provide you with exceptional heart care, our providers are all part of one team.  This team includes your primary Cardiologist (physician) and Advanced Practice Providers or APPs (Physician Assistants and Nurse Practitioners) who all work together to provide you with the care you need, when you need it.  Your next appointment:    As needed  Provider:   Dr. Barbaraann  We recommend signing up for the patient portal called MyChart.  Sign up information is provided on this After Visit Summary.  MyChart is used to connect with patients for Virtual  Visits (Telemedicine).  Patients are able to view lab/test results, encounter notes, upcoming appointments, etc.  Non-urgent messages can be sent to your provider as well.   To learn more about what you can do with MyChart, go to [email or web address].   Other Instructions

## 2024-06-01 ENCOUNTER — Ambulatory Visit: Payer: Self-pay | Admitting: Cardiovascular Disease

## 2024-06-15 ENCOUNTER — Encounter (HOSPITAL_COMMUNITY): Payer: Self-pay

## 2024-06-19 ENCOUNTER — Other Ambulatory Visit: Payer: Self-pay | Admitting: Cardiology

## 2024-06-19 ENCOUNTER — Ambulatory Visit (HOSPITAL_COMMUNITY)
Admission: RE | Admit: 2024-06-19 | Discharge: 2024-06-19 | Disposition: A | Source: Ambulatory Visit | Attending: Cardiovascular Disease

## 2024-06-19 ENCOUNTER — Other Ambulatory Visit: Payer: Self-pay | Admitting: *Deleted

## 2024-06-19 ENCOUNTER — Telehealth: Payer: Self-pay | Admitting: *Deleted

## 2024-06-19 ENCOUNTER — Encounter: Payer: Self-pay | Admitting: *Deleted

## 2024-06-19 ENCOUNTER — Ambulatory Visit: Payer: Self-pay | Admitting: Cardiovascular Disease

## 2024-06-19 ENCOUNTER — Ambulatory Visit (HOSPITAL_COMMUNITY)
Admission: RE | Admit: 2024-06-19 | Discharge: 2024-06-19 | Disposition: A | Source: Ambulatory Visit | Attending: Cardiology | Admitting: Cardiology

## 2024-06-19 DIAGNOSIS — R072 Precordial pain: Secondary | ICD-10-CM

## 2024-06-19 DIAGNOSIS — R931 Abnormal findings on diagnostic imaging of heart and coronary circulation: Secondary | ICD-10-CM | POA: Diagnosis not present

## 2024-06-19 DIAGNOSIS — I2510 Atherosclerotic heart disease of native coronary artery without angina pectoris: Secondary | ICD-10-CM

## 2024-06-19 MED ORDER — NITROGLYCERIN 0.4 MG SL SUBL: 0.4000 mg | SUBLINGUAL_TABLET | SUBLINGUAL | 0 refills | Status: AC | PRN

## 2024-06-19 MED ORDER — IOHEXOL 350 MG/ML SOLN
100.0000 mL | Freq: Once | INTRAVENOUS | Status: AC | PRN
  Administered 2024-06-19: 100 mL via INTRAVENOUS

## 2024-06-19 MED ORDER — NITROGLYCERIN 0.4 MG SL SUBL: 0.8000 mg | SUBLINGUAL_TABLET | Freq: Once | SUBLINGUAL | Status: DC

## 2024-06-19 MED ORDER — ATORVASTATIN CALCIUM 40 MG PO TABS: 40.0000 mg | ORAL_TABLET | Freq: Every day | ORAL | 3 refills | Status: DC

## 2024-06-19 MED ORDER — ASPIRIN 81 MG PO TBEC: 81.0000 mg | DELAYED_RELEASE_TABLET | Freq: Every day | ORAL | Status: AC

## 2024-06-19 NOTE — Telephone Encounter (Signed)
 Called Ms. Sorci.  Discussed results of her coronary CTA.  She has a severe proximal LAD stenosis 70 to 99%.  She denies any chest pain but is getting short of breath.  She also describes presyncopal symptoms.  We discussed that this lesion is severe and needs to be fixed with PCI.  I will call in a prescription for aspirin  81 mg daily and Lipitor 40 mg daily.  I will give her prescription for Supple and nitroglycerin .  She is not having any chest pain we discussed that anything can happen.  She will have this in case.  We will set her up for a cath either this week or early next week.  She was in agreement with this plan and appreciative of the call.  Signed, Darryle DASEN. Barbaraann, MD, [facility]  [facility]  [address] [place], [place] [address] [phone]  3:03 PM

## 2024-06-19 NOTE — Addendum Note (Signed)
 Addended by: Kellyn Mansfield L on: 06/19/2024 04:37 PM   Modules accepted: Orders

## 2024-06-19 NOTE — Telephone Encounter (Signed)
 Spoke with pt, aware per dr barbaraann, her cath will be 06/27/24 @ 7:30am. She is aware she will need to be there at 5:30 am. Will create letter of instructions and mail to the patient.

## 2024-06-21 ENCOUNTER — Telehealth: Payer: Self-pay | Admitting: Cardiovascular Disease

## 2024-06-21 DIAGNOSIS — H35363 Drusen (degenerative) of macula, bilateral: Secondary | ICD-10-CM | POA: Diagnosis not present

## 2024-06-21 DIAGNOSIS — H04123 Dry eye syndrome of bilateral lacrimal glands: Secondary | ICD-10-CM | POA: Diagnosis not present

## 2024-06-21 DIAGNOSIS — H35033 Hypertensive retinopathy, bilateral: Secondary | ICD-10-CM | POA: Diagnosis not present

## 2024-06-21 DIAGNOSIS — H25813 Combined forms of age-related cataract, bilateral: Secondary | ICD-10-CM | POA: Diagnosis not present

## 2024-06-21 NOTE — Telephone Encounter (Signed)
 Left message for patient to call back for Dr. Rosslyn recommendations.

## 2024-06-21 NOTE — Telephone Encounter (Signed)
 Left message for patient to call back

## 2024-06-21 NOTE — Telephone Encounter (Signed)
 Patient returned RN's call.

## 2024-06-21 NOTE — Telephone Encounter (Signed)
 Call transferred to triage nurse.   Patient states she developed a UTI, and she believes it is because of the atorvastatin . She also has muscle aches since starting the medication.   She has not taken any today and will hold until she hears back from our office.  Patient requested Dr. Barbaraann call in medication to clear up her UTI. Advised patient she will need to contact her PCP for this. Patient verbalized understanding.

## 2024-06-21 NOTE — Telephone Encounter (Signed)
 Pt c/o medication issue:  1. Name of Medication:   atorvastatin  (LIPITOR) 40 MG tablet    2. How are you currently taking this medication (dosage and times per day)? As written   3. Are you having a reaction (difficulty breathing--STAT)? No   4. What is your medication issue? Pt called in stating she is having UTI since taking this med. She states she read that this is a symptoms. She asked if Dr. Barbaraann can prescribe her something for it. Please advise.

## 2024-06-26 ENCOUNTER — Telehealth: Payer: Self-pay | Admitting: *Deleted

## 2024-06-26 NOTE — Telephone Encounter (Addendum)
 Cardiac Catheterization scheduled at [facility] for: Tuesday June 27, 2024 7:30 AM Arrival time [facility] Main Entrance A at: 5:30 AM  Diet: -Nothing to eat after midnight.  Hydration: -May drink clear liquids until 2 hours before the procedure.  Approved liquids: Water , clear tea, black coffee, fruit juices-non-citric and without pulp,Gatorade, plain Jello/popsicles.   -Please drink 16 oz of water  2 hours before procedure.  Medication instructions: -Usual morning medications can be taken including aspirin  81 mg.  Patient tells me she is tolerating aspirin  81 mg.  Plan to go home the same day, you will only stay overnight if medically necessary.  You must have responsible adult to drive you home.  Someone must be with you the first 24 hours after you arrive home.  Reviewed procedure instructions with patient.  Patient tells me she had burning on urination several days ago, no foul odor/urine discoloration, no fever.  Patient tells me she took atorvastatin  yesterday, tolerated it okay, is willing to continue atorvastatin  for now.

## 2024-06-27 ENCOUNTER — Encounter (HOSPITAL_COMMUNITY): Admission: RE | Disposition: A | Payer: Self-pay | Attending: Cardiology

## 2024-06-27 ENCOUNTER — Other Ambulatory Visit: Payer: Self-pay

## 2024-06-27 ENCOUNTER — Other Ambulatory Visit (HOSPITAL_COMMUNITY): Payer: Self-pay

## 2024-06-27 ENCOUNTER — Ambulatory Visit (HOSPITAL_COMMUNITY)
Admission: RE | Admit: 2024-06-27 | Discharge: 2024-06-27 | Disposition: A | Attending: Cardiology | Admitting: Cardiology

## 2024-06-27 DIAGNOSIS — I25119 Atherosclerotic heart disease of native coronary artery with unspecified angina pectoris: Secondary | ICD-10-CM

## 2024-06-27 DIAGNOSIS — Z87891 Personal history of nicotine dependence: Secondary | ICD-10-CM | POA: Diagnosis not present

## 2024-06-27 DIAGNOSIS — R011 Cardiac murmur, unspecified: Secondary | ICD-10-CM | POA: Insufficient documentation

## 2024-06-27 DIAGNOSIS — I10 Essential (primary) hypertension: Secondary | ICD-10-CM | POA: Diagnosis not present

## 2024-06-27 DIAGNOSIS — E785 Hyperlipidemia, unspecified: Secondary | ICD-10-CM | POA: Diagnosis not present

## 2024-06-27 DIAGNOSIS — Z8249 Family history of ischemic heart disease and other diseases of the circulatory system: Secondary | ICD-10-CM | POA: Diagnosis not present

## 2024-06-27 DIAGNOSIS — Z79899 Other long term (current) drug therapy: Secondary | ICD-10-CM | POA: Diagnosis not present

## 2024-06-27 DIAGNOSIS — Z01812 Encounter for preprocedural laboratory examination: Secondary | ICD-10-CM

## 2024-06-27 DIAGNOSIS — Z8673 Personal history of transient ischemic attack (TIA), and cerebral infarction without residual deficits: Secondary | ICD-10-CM | POA: Diagnosis not present

## 2024-06-27 DIAGNOSIS — Z7902 Long term (current) use of antithrombotics/antiplatelets: Secondary | ICD-10-CM | POA: Diagnosis not present

## 2024-06-27 DIAGNOSIS — R931 Abnormal findings on diagnostic imaging of heart and coronary circulation: Secondary | ICD-10-CM

## 2024-06-27 DIAGNOSIS — I2510 Atherosclerotic heart disease of native coronary artery without angina pectoris: Secondary | ICD-10-CM

## 2024-06-27 DIAGNOSIS — Z7982 Long term (current) use of aspirin: Secondary | ICD-10-CM | POA: Diagnosis not present

## 2024-06-27 DIAGNOSIS — Z955 Presence of coronary angioplasty implant and graft: Secondary | ICD-10-CM

## 2024-06-27 HISTORY — PX: CORONARY IMAGING/OCT: CATH118326

## 2024-06-27 HISTORY — PX: CORONARY STENT INTERVENTION: CATH118234

## 2024-06-27 HISTORY — PX: LEFT HEART CATH AND CORONARY ANGIOGRAPHY: CATH118249

## 2024-06-27 LAB — CBC
HCT: 38.7 % (ref 36.0–46.0)
Hemoglobin: 12.4 g/dL (ref 12.0–15.0)
MCH: 28.7 pg (ref 26.0–34.0)
MCHC: 32 g/dL (ref 30.0–36.0)
MCV: 89.6 fL (ref 80.0–100.0)
Platelets: 211 K/uL (ref 150–400)
RBC: 4.32 MIL/uL (ref 3.87–5.11)
RDW: 12.9 % (ref 11.5–15.5)
WBC: 5.7 K/uL (ref 4.0–10.5)
nRBC: 0 % (ref 0.0–0.2)

## 2024-06-27 LAB — POCT ACTIVATED CLOTTING TIME
Activated Clotting Time: 214 s
Activated Clotting Time: 230 s
Activated Clotting Time: 256 s

## 2024-06-27 SURGERY — LEFT HEART CATH AND CORONARY ANGIOGRAPHY
Anesthesia: LOCAL

## 2024-06-27 MED ORDER — NITROGLYCERIN 1 MG/10 ML FOR IR/CATH LAB
INTRA_ARTERIAL | Status: DC | PRN
  Administered 2024-06-27 (×2): 200 ug via INTRACORONARY

## 2024-06-27 MED ORDER — PANTOPRAZOLE SODIUM 40 MG PO TBEC
40.0000 mg | DELAYED_RELEASE_TABLET | Freq: Every day | ORAL | 3 refills | Status: AC
  Filled 2024-06-27: qty 90, 90d supply, fill #0

## 2024-06-27 MED ORDER — HEPARIN SODIUM (PORCINE) 1000 UNIT/ML IJ SOLN
INTRAMUSCULAR | Status: AC
  Filled 2024-06-27: qty 10

## 2024-06-27 MED ORDER — HEPARIN SODIUM (PORCINE) 1000 UNIT/ML IJ SOLN
INTRAMUSCULAR | Status: DC | PRN
  Administered 2024-06-27: 08:00:00 3500 [IU] via INTRAVENOUS
  Administered 2024-06-27 (×2): 3000 [IU] via INTRAVENOUS
  Administered 2024-06-27: 09:00:00 2000 [IU] via INTRAVENOUS
  Administered 2024-06-27: 08:00:00 4000 [IU] via INTRAVENOUS

## 2024-06-27 MED ORDER — ASPIRIN 81 MG PO TBEC
81.0000 mg | DELAYED_RELEASE_TABLET | Freq: Every day | ORAL | Status: DC
  Filled 2024-06-27: qty 1

## 2024-06-27 MED ORDER — HYDRALAZINE HCL 20 MG/ML IJ SOLN: 10.0000 mg | INTRAMUSCULAR | Status: AC | PRN

## 2024-06-27 MED ORDER — FENTANYL CITRATE (PF) 100 MCG/2ML IJ SOLN
INTRAMUSCULAR | Status: DC | PRN
  Administered 2024-06-27: 08:00:00 25 ug via INTRAVENOUS

## 2024-06-27 MED ORDER — MIDAZOLAM HCL (PF) 2 MG/2ML IJ SOLN
INTRAMUSCULAR | Status: DC | PRN
  Administered 2024-06-27: 08:00:00 1 mg via INTRAVENOUS

## 2024-06-27 MED ORDER — MIDAZOLAM HCL 2 MG/2ML IJ SOLN
INTRAMUSCULAR | Status: AC
  Filled 2024-06-27: qty 2

## 2024-06-27 MED ORDER — ONDANSETRON HCL 4 MG/2ML IJ SOLN: 4.0000 mg | Freq: Four times a day (QID) | INTRAMUSCULAR | Status: DC | PRN

## 2024-06-27 MED ORDER — SODIUM CHLORIDE 0.9 % IV SOLN: 250.0000 mL | INTRAVENOUS | Status: DC | PRN

## 2024-06-27 MED ORDER — CLOPIDOGREL BISULFATE 300 MG PO TABS
ORAL_TABLET | ORAL | Status: AC
  Filled 2024-06-27: qty 2

## 2024-06-27 MED ORDER — HEPARIN (PORCINE) IN NACL 1000-0.9 UT/500ML-% IV SOLN
INTRAVENOUS | Status: DC | PRN
  Administered 2024-06-27 (×3): 500 mL

## 2024-06-27 MED ORDER — LABETALOL HCL 5 MG/ML IV SOLN: 10.0000 mg | INTRAVENOUS | Status: AC | PRN

## 2024-06-27 MED ORDER — LOSARTAN POTASSIUM 25 MG PO TABS
25.0000 mg | ORAL_TABLET | Freq: Every day | ORAL | 3 refills | Status: DC
  Filled 2024-06-27: qty 90, 90d supply, fill #0

## 2024-06-27 MED ORDER — SODIUM CHLORIDE 0.9% FLUSH: 3.0000 mL | INTRAVENOUS | Status: DC | PRN

## 2024-06-27 MED ORDER — VERAPAMIL HCL 2.5 MG/ML IV SOLN
INTRAVENOUS | Status: DC | PRN
  Administered 2024-06-27: 08:00:00 10 mL via INTRA_ARTERIAL

## 2024-06-27 MED ORDER — LIDOCAINE HCL (PF) 1 % IJ SOLN
INTRAMUSCULAR | Status: DC | PRN
  Administered 2024-06-27: 08:00:00 5 mL via INTRADERMAL

## 2024-06-27 MED ORDER — FREE WATER: 500.0000 mL | Freq: Once | Status: DC

## 2024-06-27 MED ORDER — VERAPAMIL HCL 2.5 MG/ML IV SOLN
INTRAVENOUS | Status: AC
  Filled 2024-06-27: qty 2

## 2024-06-27 MED ORDER — FENTANYL CITRATE (PF) 100 MCG/2ML IJ SOLN
INTRAMUSCULAR | Status: AC
  Filled 2024-06-27: qty 2

## 2024-06-27 MED ORDER — CLOPIDOGREL BISULFATE 300 MG PO TABS
ORAL_TABLET | ORAL | Status: AC
  Filled 2024-06-27: qty 1

## 2024-06-27 MED ORDER — CLOPIDOGREL BISULFATE 300 MG PO TABS
ORAL_TABLET | ORAL | Status: DC | PRN
  Administered 2024-06-27: 08:00:00 600 mg via ORAL

## 2024-06-27 MED ORDER — NITROGLYCERIN 1 MG/10 ML FOR IR/CATH LAB
INTRA_ARTERIAL | Status: AC
  Filled 2024-06-27: qty 10

## 2024-06-27 MED ORDER — LIDOCAINE HCL (PF) 1 % IJ SOLN
INTRAMUSCULAR | Status: AC
  Filled 2024-06-27: qty 30

## 2024-06-27 MED ORDER — CLOPIDOGREL BISULFATE 75 MG PO TABS
75.0000 mg | ORAL_TABLET | Freq: Every day | ORAL | 3 refills | Status: AC
  Filled 2024-06-27: qty 90, 90d supply, fill #0

## 2024-06-27 MED ORDER — SODIUM CHLORIDE 0.9% FLUSH: 3.0000 mL | Freq: Two times a day (BID) | INTRAVENOUS | Status: DC

## 2024-06-27 MED ORDER — CLOPIDOGREL BISULFATE 75 MG PO TABS: 75.0000 mg | ORAL_TABLET | Freq: Every day | ORAL | Status: DC

## 2024-06-27 MED ORDER — ACETAMINOPHEN 325 MG PO TABS: 650.0000 mg | ORAL_TABLET | ORAL | Status: DC | PRN

## 2024-06-27 SURGICAL SUPPLY — 16 items
BALLOON EMERGE MR 2.25X12 (BALLOONS) IMPLANT
BALLOON SAPPHIRE NC24 4.0X12 (BALLOONS) IMPLANT
CATH DRAGONFLY OPSTAR (CATHETERS) IMPLANT
CATH INFINITI AMBI 5FR TG (CATHETERS) IMPLANT
CATH VISTA GUIDE 6FR XBLD 3.5 (CATHETERS) IMPLANT
DEVICE RAD TR BAND REGULAR (VASCULAR PRODUCTS) IMPLANT
GLIDESHEATH SLEND SS 6F .021 (SHEATH) IMPLANT
GUIDEWIRE INQWIRE 1.5J.035X260 (WIRE) IMPLANT
KIT ENCORE 26 ADVANTAGE (KITS) IMPLANT
KIT SINGLE USE MANIFOLD (KITS) IMPLANT
KIT SYRINGE INJ CVI SPIKEX1 (MISCELLANEOUS) IMPLANT
PACK CARDIAC CATHETERIZATION (CUSTOM PROCEDURE TRAY) ×1 IMPLANT
SET ATX-X65L (MISCELLANEOUS) IMPLANT
SHEATH PROBE COVER 6X72 (BAG) IMPLANT
STENT SYNERGY XD 3.50X16 (Permanent Stent) IMPLANT
WIRE ASAHI PROWATER 180CM (WIRE) IMPLANT

## 2024-06-27 NOTE — Discharge Summary (Signed)
 Discharge Summary for Same Day PCI   Patient ID: Karen Delgado MRN: [id number]; DOB: [birth year]  Admit date: 06/27/2024 Discharge date: 06/27/2024  Primary Care Provider: Okey Carlin Redbird, MD  Primary Cardiologist: Darryle ONEIDA Decent, MD  Primary Electrophysiologist:  None   Discharge Diagnoses    Principal Problem:   CAD (coronary artery disease), native coronary artery Active Problems:   S/P drug eluting coronary stent placement   Hypertension   Heart murmur  Diagnostic Studies/Procedures    Cardiac Catheterization 06/27/2024:    LAD is a large-caliber wraparound vessel perfusing the distal third of the PDA territory   (Prox-)mid LAD lesion is 95% stenosed.  (At SP1)   A drug-eluting stent was successfully placed using a STENT SYNERGY XD 3.50X16-deployed to 3.75 mm and postdilated to 4.1 mm proximally. Post intervention, there is a 0% residual stenosis.  TIMI-3 flow maintained   Otherwise angiographically normal coronaries with a left dominant system   Large dominant LCx gives rise to a large OM1, 2 PL branches and PDA.  Small nondominant RCA.   Difficult assessment LVEDP. Been relatively normal. LV gram not performed   Diagnostic (Dominance: Left)                                                                             Intervention  Severe single-vessel CAD consistent with Coronary CTA findings with proximal to mid LAD 90% stenosis (MLA 1.3 mm)  Successful OCT guided DES PCI with Synergy XD 3.5 mm x 16 mm deployed to 3.75 mm postdilated proximally to 4.1 mm.   Lesion reduced to 0% with TIMI-3 flow maintained.    RECOMMENDATIONS   In the absence of any other complications or medical issues, we expect the patient to be ready for discharge from an interventional cardiology perspective on 06/27/2024.   Continue to titrate GDMT for CAD. Follow-up with Dr. Darryle Decent or APP for post cath follow-up   Recommend uninterrupted dual antiplatelet therapy with Aspirin  81mg   daily and Clopidogrel  75mg  daily for a minimum of 6 months (stable ischemic heart disease-Class I recommendation).   Would continue Plavix  to complete 1 year post PCI having stopped aspirin  after 3-6 months.  Based on her intolerance to aspirin  I would probably continue Plavix  monotherapy going forward but after 6 months can be held for procedures. _____________   History of Present Illness     Karen Delgado is a 78 y.o. female with past medical history of hypertension who was asked to be evaluated by Dr. Decent for a heart murmur.  During her appointment she noted some precordial chest discomfort, shortness of breath.  A coronary CTA was ordered to assess her arteries.  CTA showed severe proximal LAD stenosis, 70 to 99%.  Patient was called to discuss the results of this, recommended cardiac catheterization with PCI for revascularization.  Cardiac catheterization was arranged for further evaluation.  Hospital Course     The patient underwent cardiac cath as noted above with Dr. Anner. Plan for DAPT with ASA/Plavix  for at least 6 months, likely stopping ASA after 3-6 months and continuing with Plavix  monotherapy x 1 year. The patient was seen by cardiac rehab while in short stay. There were  no observed complications post cath. Radial cath site was re-evaluated prior to discharge and found to be stable without any complications. While in short stay she developed a small hematoma that resolved with pressure held by RN. There was no recurrence when checked on later during the day. Discussed that as long as site remained stable she is okay to be discharged at scheduled time. RN was notified to alert PA or MD if anything were to change with her site. Instructions/precautions regarding cath site care were given prior to discharge.  Karen Delgado was seen by Dr. Anner and determined stable for discharge home. Follow up with our office has been arranged. Medications are listed below. Pertinent changes  include starting DAPT with ASA and Plavix  as above, changing Prilosec to Protonix . Patient is scheduled to get outpatient echocardiogram on 07/17/2024 additional recommendations to follow assessment of LVEF. Continue Losartan  25 mg daily, continue Lipitor 40 mg daily.    _____________  Cath/PCI Registry Performance & Quality Measures: Aspirin  prescribed? - Yes ADP Receptor Inhibitor (Plavix /Clopidogrel , Brilinta/Ticagrelor or Effient/Prasugrel) prescribed (includes medically managed patients)? - Yes High Intensity Statin (Lipitor 40-80mg  or Crestor 20-40mg ) prescribed? - Yes For EF <40%, was ACEI/ARB prescribed? - echo 07/17/2024 to assess EF, already on Losartan  25mg  daily  For EF <40%, Aldosterone Antagonist (Spironolactone or Eplerenone) prescribed? - No - Outpatient Echocardiogram to assess EF will be scheduled. Cardiac Rehab Phase II ordered (Included Medically managed Patients)? - Yes _____________  Discharge Vitals Blood pressure (!) 142/75, pulse 64, temperature 97.9 F (36.6 C), temperature source Oral, resp. rate 19, height 5' 7 (1.702 m), weight 69.4 kg, SpO2 99%.  Filed Weights   06/27/24 0552  Weight: 69.4 kg   Last Labs & Radiologic Studies    CBC Recent Labs    06/27/24 0557  WBC 5.7  HGB 12.4  HCT 38.7  MCV 89.6  PLT 211   Basic Metabolic Panel No results for input(s): NA, K, CL, CO2, GLUCOSE, BUN, CREATININE, CALCIUM , MG, PHOS in the last 72 hours. Liver Function Tests No results for input(s): AST, ALT, ALKPHOS, BILITOT, PROT, ALBUMIN in the last 72 hours. No results for input(s): LIPASE, AMYLASE in the last 72 hours. High Sensitivity Troponin:   No results for input(s): TROPONINIHS in the last 720 hours.  BNP Invalid input(s): POCBNP D-Dimer No results for input(s): DDIMER in the last 72 hours. Hemoglobin A1C No results for input(s): HGBA1C in the last 72 hours. Fasting Lipid Panel No results for input(s):  CHOL, HDL, LDLCALC, TRIG, CHOLHDL, LDLDIRECT in the last 72 hours. Thyroid  Function Tests No results for input(s): TSH, T4TOTAL, T3FREE, THYROIDAB in the last 72 hours.  Invalid input(s): FREET3 _____________  CARDIAC CATHETERIZATION Result Date: 06/27/2024 Table formatting from the original result was not included. Images from the original result were not included.   LAD is a large-caliber wraparound vessel perfusing the distal third of the PDA territory   (Prox-)mid LAD lesion is 95% stenosed.  (At SP1)   A drug-eluting stent was successfully placed using a STENT SYNERGY XD 3.50X16-deployed to 3.75 mm and postdilated to 4.1 mm proximally. Post intervention, there is a 0% residual stenosis.  TIMI-3 flow maintained   Otherwise angiographically normal coronaries with a left dominant system   Large dominant LCx gives rise to a large OM1, 2 PL branches and PDA.  Small nondominant RCA.   Difficult assessment LVEDP. Been relatively normal. LV gram not performed Diagnostic (Dominance: Left)      Intervention  Severe single-vessel  CAD consistent with Coronary CTA findings with proximal to mid LAD 90% stenosis (MLA 1.3 mm) Successful OCT guided DES PCI with Synergy XD 3.5 mm x 16 mm deployed to 3.75 mm postdilated proximally to 4.1 mm.  Lesion reduced to 0% with TIMI-3 flow maintained. RECOMMENDATIONS   In the absence of any other complications or medical issues, we expect the patient to be ready for discharge from an interventional cardiology perspective on 06/27/2024.   Continue to titrate GDMT for CAD. Follow-up with Dr. Darryle Decent or APP for post cath follow-up   Recommend uninterrupted dual antiplatelet therapy with Aspirin  81mg  daily and Clopidogrel  75mg  daily for a minimum of 6 months (stable ischemic heart disease-Class I recommendation).   Would continue Plavix  to complete 1 year post PCI having stopped aspirin  after 3-6 months.  Based on her intolerance to aspirin  I would probably  continue Plavix  monotherapy going forward but after 6 months can be held for procedures. Alm Clay, MD  CT CORONARY FRACTIONAL FLOW RESERVE FLUID ANALYSIS Result Date: 06/19/2024 EXAM: FFRCT ANALYSIS FINDINGS: FFRct analysis was performed on the original cardiac CT angiogram dataset. Diagrammatic representation of the FFRct analysis is provided in a separate PDF document in PACS. This dictation was created using the PDF document and an interactive 3D model of the results. 3D model is not available in the EMR/PACS. Normal FFR range is >0.80. 1. Left Main: findings 2. LAD: findings 0.65, 0.58 3. LCX: findings 0.93 4. RCA: findings 0.95 IMPRESSION: FFR consistent with flow limiting stenosis in the proximal LAD. Note: These examples are not recommendations of [employer] and only provided as examples of what other customers are doing. Electronically Signed   By: Redell Shallow M.D.   On: 06/19/2024 13:30   CT CORONARY MORPH W/CTA COR W/SCORE W/CA W/CM &/OR WO/CM Result Date: 06/19/2024 CLINICAL DATA:  78 yo female with DOE EXAM: Cardiac/Coronary CTA TECHNIQUE: A non-contrast, gated CT scan was obtained with axial slices of 2.5 mm through the heart for calcium  scoring. Calcium  scoring was performed using the Agatston method. A 120 kV prospective, gated, contrast cardiac CT scan was obtained. Gantry rotation speed was 230 msec and collimation was 0.63 mm. Two sublingual nitroglycerin  tablets (0.8 mg) were given. The 3D data set was reconstructed with motion correction for the best systolic or diastolic phase. Images were analyzed on a dedicated workstation using MPR, MIP, and VRT modes. The patient received 95 cc of contrast. FINDINGS: Image quality: Excellent. Noise artifact is: Limited. Coronary Arteries:  Normal coronary origin.  Left dominance. Left main: The left main is a large caliber vessel with a normal take off from the left coronary cusp that bifurcates to form a left anterior descending artery and a  left circumflex artery. There is no plaque or stenosis. Left anterior descending artery: The LAD has severe (70-99) soft plaque stenosis in the proximal vessel. The LAD gives off 2 small patent diagonal branches. Left circumflex artery: The LCX is dominant and patent with no evidence of plaque or stenosis. The LCX gives off 2 patent obtuse marginal branches and terminates as a PDA and right posterolateral branch; minimal (0-24) plaque noted in the posterolateral. Right coronary artery: The RCA is non-dominant with normal take off from the right coronary cusp. There is no evidence of plaque or stenosis. Right Atrium: Right atrial size is within normal limits. Right Ventricle: The right ventricular cavity is within normal limits. Left Atrium: Left atrial size is normal in size with no left atrial appendage filling  defect. Left Ventricle: The ventricular cavity size is within normal limits. LVH noted. Pulmonary arteries: Normal in size. Pulmonary veins: Normal pulmonary venous drainage. Pericardium: Normal thickness without significant effusion or calcium  present. Cardiac valves: The aortic valve is trileaflet without significant calcification. The mitral valve is normal without significant calcification. Aorta: Normal caliber with aortic atherosclerosis. Extra-cardiac findings: See attached radiology report for non-cardiac structures. IMPRESSION: 1. Coronary calcium  score of 2.06. This was 94 percentile for age-, sex, and race-matched controls. 2. Aortic atherosclerosis. 3. LVH noted. 3. Normal coronary origin with left dominance. 4. Severe (70-99) soft plaque stenosis in the proximal LAD. 5. Study will be sent for FFR. RECOMMENDATIONS: CAD-RADS 4: Severe stenosis. (70-99% or > 50% left main). Cardiac catheterization or CT FFR is recommended. Consider symptom-guided anti-ischemic pharmacotherapy as well as risk factor modification per guideline directed care. Invasive coronary angiography recommended with  revascularization per published guideline statements. Redell Shallow, MD Electronically Signed   By: Redell Shallow M.D.   On: 06/19/2024 13:06   MM 3D SCREENING MAMMOGRAM BILATERAL BREAST Result Date: 06/02/2024 CLINICAL DATA:  Screening. EXAM: DIGITAL SCREENING BILATERAL MAMMOGRAM WITH TOMOSYNTHESIS AND CAD TECHNIQUE: Bilateral screening digital craniocaudal and mediolateral oblique mammograms were obtained. Bilateral screening digital breast tomosynthesis was performed. The images were evaluated with computer-aided detection. COMPARISON:  Previous exam(s). ACR Breast Density Category b: There are scattered areas of fibroglandular density. FINDINGS: There are no findings suspicious for malignancy. IMPRESSION: No mammographic evidence of malignancy. A result letter of this screening mammogram will be mailed directly to the patient. RECOMMENDATION: Screening mammogram in one year. (Code:SM-B-01Y) BI-RADS CATEGORY  1: Negative. Electronically Signed   By: Rosina Gelineau M.D.   On: 06/02/2024 12:20    Disposition   Pt is being discharged home today in good condition per MD.  Follow-up Plans & Appointments   Outpatient echocardiogram scheduled 07/17/2024 General cardiology appt scheduled 07/20/2024   Future Appointments  Date Time Provider Department Center  07/17/2024  1:00 PM [facility] 4 [facility]  07/20/2024 10:55 AM Dunn, Raphael SAILOR, PA-C [facility]  10/02/2024 10:15 AM Mannam, Praveen, MD [facility] [phone]   Discharge Instructions     Amb Referral to Cardiac Rehabilitation   Complete by: As directed    Diagnosis: Coronary Stents   After initial evaluation and assessments completed: Virtual Based Care may be provided alone or in conjunction with Phase 2 Cardiac Rehab based on patient barriers.: Yes   Intensive Cardiac Rehabilitation (ICR) [facility] location only OR Traditional Cardiac Rehabilitation (TCR) *If criteria for ICR are not met will enroll in TCR [facility] only): Yes   Discharge  instructions   Complete by: As directed    Radial Site Care Refer to this sheet in the next few weeks. These instructions provide you with information on caring for yourself after your procedure. Your caregiver may also give you more specific instructions. Your treatment has been planned according to current medical practices, but problems sometimes occur. Call your caregiver if you have any problems or questions after your procedure. HOME CARE INSTRUCTIONS You may shower the day after the procedure. Remove the bandage (dressing) and gently wash the site with plain soap and water . Gently pat the site dry.  Do not apply powder or lotion to the site.  Do not submerge the affected site in water  for 3 to 5 days.  Inspect the site at least twice daily.  Do not flex or bend the affected arm for 24 hours.  No lifting over 5  pounds (2.3 kg) for 5 days after your procedure.  Do not drive home if you are discharged the same day of the procedure. Have someone else drive you.  You may drive 24 hours after the procedure unless otherwise instructed by your caregiver.  What to expect: Any bruising will usually fade within 1 to 2 weeks.  Blood that collects in the tissue (hematoma) may be painful to the touch. It should usually decrease in size and tenderness within 1 to 2 weeks.  SEEK IMMEDIATE MEDICAL CARE IF: You have unusual pain at the radial site.  You have redness, warmth, swelling, or pain at the radial site.  You have drainage (other than a small amount of blood on the dressing).  You have chills.  You have a fever or persistent symptoms for more than 72 hours.  You have a fever and your symptoms suddenly get worse.  Your arm becomes pale, cool, tingly, or numb.  You have heavy bleeding from the site. Hold pressure on the site.   PLEASE DO NOT MISS ANY DOSES OF YOUR PLAVIX !!!!! Also keep a log of you blood pressures and bring back to your follow up appt. Please call the office with any questions.    Patients taking blood thinners should generally stay away from medicines like ibuprofen, Advil, Motrin, naproxen, and Aleve due to risk of stomach bleeding. You may take Tylenol  as directed or talk to your primary doctor about alternatives.  Some studies suggest Prilosec/Omeprazole interacts with Plavix . We changed your Prilosec/Omeprazole to the equivalent dose of Protonix  for less chance of interaction.  PLEASE ENSURE THAT YOU DO NOT RUN OUT OF YOUR PLAVIX . This medication is very important to remain on for at least 6 months, likely up to 1 year. IF you have issues obtaining this medication due to cost please CALL the office 3-5 business days prior to running out in order to prevent missing doses of this medication.      Discharge Medications   Allergies as of 06/27/2024       Reactions   Tomato Itching, Rash        Medication List     STOP taking these medications    metoprolol  tartrate 100 MG tablet Commonly known as: LOPRESSOR    omeprazole 20 MG capsule Commonly known as: PRILOSEC       TAKE these medications    aspirin  EC 81 MG tablet Take 1 tablet (81 mg total) by mouth daily. Swallow whole.   atorvastatin  40 MG tablet Commonly known as: LIPITOR Take 1 tablet (40 mg total) by mouth daily.   AZO URINARY PAIN PO Take 1 tablet by mouth See admin instructions. Taper dose   Centrum Silver Ultra Womens Tabs Take 1 tablet by mouth every other day.   clopidogrel  75 MG tablet Commonly known as: PLAVIX  Take 1 tablet (75 mg total) by mouth daily with breakfast. Start taking on: June 28, 2024   ferrous sulfate 325 (65 FE) MG tablet Take 325 mg by mouth See admin instructions. Alternates between taking 1 tablet daily and 2 tablets the next day   GLUCOSAMINE PO Take 250 mg by mouth daily.   leflunomide 10 MG tablet Commonly known as: ARAVA Take 10 mg by mouth daily.   losartan  25 MG tablet Commonly known as: COZAAR  Take 1 tablet (25 mg total) by mouth  daily. What changed:  medication strength how much to take   nitroGLYCERIN  0.4 MG SL tablet Commonly known as: NITROSTAT  Place 1 tablet (0.4  mg total) under the tongue every 5 (five) minutes as needed for chest pain.   pantoprazole  40 MG tablet Commonly known as: Protonix  Take 1 tablet (40 mg total) by mouth daily.   REFRESH OP Place 1 drop into both eyes daily as needed (dry eyes).   Simponi  Aria 50 MG/4ML Soln injection Generic drug: golimumab  Inject 50 mg into the vein once.   TUMS PO Take 1 tablet by mouth daily as needed.   valACYclovir 1000 MG tablet Commonly known as: VALTREX Take 1,000 mg by mouth daily as needed.        Allergies Allergies[1]  Outstanding Labs/Studies   Echocardiogram, already scheduled   Duration of Discharge Encounter   Greater than 30 minutes including physician time.  Signed, Waddell DELENA Donath, PA-C 06/27/2024, 1:24 PM     [1]  Allergies Allergen Reactions   Tomato Itching and Rash

## 2024-06-27 NOTE — Progress Notes (Signed)
 CARDIAC REHAB PHASE I   Stent education reviewed with patient and patient's daughter including restrictions, antiplatelet use, CP, NTG use, and calling 911, risk factor modification, and activity progression. Stent card given. Heart healthy diet and exercise handouts given. Discussed phase 2 cardiac rehab, and patient is interested in the program at [facility]. Referral sent. Patient verbalizes understanding and is receptive to information given.  Arnoldo CHRISTELLA Gal, MS, ACSM CEP 06/27/2024 [phone]

## 2024-06-27 NOTE — Interval H&P Note (Signed)
 History and Physical Interval Note:  06/27/2024 7:22 AM  Karen Delgado  has presented today for surgery, with the diagnosis of abnormal cta.  The various methods of treatment have been discussed with the patient and family. After consideration of risks, benefits and other options for treatment, the patient has consented to  Procedures: LEFT HEART CATH AND CORONARY ANGIOGRAPHY (N/A)  PERCUTANEOUS CORONARY INTERVENTION  as a surgical intervention.  The patient's history has been reviewed, patient examined, no change in status, stable for surgery.  I have reviewed the patient's chart and labs.  Questions were answered to the patient's satisfaction.   Cath Lab Visit (complete for each Cath Lab visit)  Clinical Evaluation Leading to the Procedure:   ACS: No.  Non-ACS:    Anginal Classification: CCS II  Anti-ischemic medical therapy: No Therapy  Non-Invasive Test Results: High-risk stress test findings: cardiac mortality >3%/year  Prior CABG: No previous CABG      Alm Clay

## 2024-06-27 NOTE — Discharge Instructions (Signed)

## 2024-06-27 NOTE — Progress Notes (Signed)
 Patient and daughter was given discharge instructions. Both verbalized understanding.

## 2024-06-28 ENCOUNTER — Telehealth (HOSPITAL_COMMUNITY): Payer: Self-pay

## 2024-06-28 ENCOUNTER — Encounter (HOSPITAL_COMMUNITY): Payer: Self-pay | Admitting: Cardiology

## 2024-06-28 NOTE — Telephone Encounter (Signed)
 Pt is not interested right now in the cardiac rehab program.  Closed referral

## 2024-06-28 NOTE — Telephone Encounter (Signed)
 Pt returning call. Please advise.

## 2024-06-28 NOTE — Telephone Encounter (Signed)
 Spoke with patient and Dr. Barbaraann recommendations given.  Stop atorvastatin . This did not cause a UTI. Start crestor 20 mg daily. Orders placed and medication sent to pharmacy of choice. Pt upset that we could not prescribe her anything for UTI. Advised pt to see PCP. Pt verbalized understanding of plan.

## 2024-07-05 ENCOUNTER — Ambulatory Visit (HOSPITAL_COMMUNITY)

## 2024-07-07 ENCOUNTER — Ambulatory Visit (HOSPITAL_COMMUNITY)

## 2024-07-11 ENCOUNTER — Telehealth: Payer: Self-pay | Admitting: Cardiology

## 2024-07-11 NOTE — Telephone Encounter (Signed)
 Pt had heart cath procedure done 12/16 and would like to know if it is normal to have gas and burps. She also complains of tenderness under right arm. Please advise.

## 2024-07-11 NOTE — Telephone Encounter (Signed)
 Patient identification verified by 2 forms.   Called and spoke to patient  Patient states:  -She has had increase gas since the procedure.  -She is unable to use her deodorant.  -I will bring the concerns up at her follow up appointment.

## 2024-07-17 ENCOUNTER — Ambulatory Visit (HOSPITAL_COMMUNITY)
Admission: RE | Admit: 2024-07-17 | Discharge: 2024-07-17 | Disposition: A | Discharge status: Home / Self Care | Source: Ambulatory Visit | Attending: Cardiovascular Disease | Admitting: Cardiovascular Disease

## 2024-07-17 DIAGNOSIS — R011 Cardiac murmur, unspecified: Secondary | ICD-10-CM | POA: Insufficient documentation

## 2024-07-17 LAB — ECHOCARDIOGRAM COMPLETE
Area-P 1/2: 2.85 cm2
P 1/2 time: 601 ms
S' Lateral: 2.4 cm

## 2024-07-18 NOTE — Progress Notes (Addendum)
 "  Cardiology Office Note    Date:  07/20/2024  ID:  Karen Delgado, DOB Jun 20, 1946, MRN [id number] PCP:  Okey Carlin Redbird, MD  Cardiologist:  Darryle ONEIDA Decent, MD  Electrophysiologist:  None   Chief Complaint: post cath f/u  History of Present Illness: .    Karen Delgado is a 79 y.o. female with visit-pertinent history of HTN, CAD, heart murmur, moderate AI, mild MR, pulmonary nodules followed by pulmonary seen for follow-up.  She established care with Dr. Decent in 05/2024 for heart murmur, SOB, pre-syncope. Calcium  score 05/2023 had been zero. Coronary CTA performed 06/19/24 showed CAC 2.06/33%ile, severe soft plaque in prox LAD with positive FFR, clear lungs, aortic atherosclerosis. Aspirin  and atorvastatin  were stopped. She called in with concern that atorvastatin  caused UTI - Dr. Decent did not feel this was the case but switched to rosuvastatin. She underwent outpatient cardiac cath 06/27/24 with 95% mLAD treated with DES, otherwise angiographically normal coronaries. Cath note recommends uninterrupted DAPT x 6 months but also notes Would continue Plavix  to complete 1 year post PCI having stopped aspirin  after 3-6 months. Based on her intolerance to aspirin  I would probably continue Plavix  monotherapy going forward but after 6 months can be held for procedures. While in short stay she developed a small hematoma that resolved with pressure held by RN but otherwise was granted OK for same day discharge. Prilosec was changed to Protonix . 2D echo 07/17/24 showed EF 60-65%, G1DD, moderate LVH, mild MR, moderate AI.   She is seen for follow-up today feeling well. She called in with some concerns after discharge about irritation from her deodorant and increased belching but this has resolved. Since cath she feels her episodes of dizziness and shortness of breath are much better. She has not had any chest pain. She is not interested in cardiac rehab due to transportation challenges - relies on church  members or family to drive her as she does not drive. She is interested in speaking with social worker about driving options.  Of note, at time of same day PCI discharge, AVS reflected ongoing rx for losartan  25mg  daily - the patient was actually taking 50mg  daily chronically at that time, so the continuation of 25mg  was actually a reduction in dose. She reports home BPs primarily 130s-140s with occasional 120s.  Labwork independently reviewed: CareEverywhere 05/2024 T chol 185, TG 56, LDL 101, HDL 73 06/2024 CBC OK 05/2024 K 4.5, Cr 0.86  ROS: .    Please see the history of present illness.  All other systems are reviewed and otherwise negative.  Studies Reviewed: SABRA    EKG:  EKG is ordered today, personally reviewed, demonstrating:  EKG Interpretation Date/Time:  Thursday July 20 2024 11:03:28 EST Ventricular Rate:  67 PR Interval:  154 QRS Duration:  86 QT Interval:  370 QTC Calculation: 390 R Axis:   23  Text Interpretation: Normal sinus rhythm Possible Left atrial enlargement Left ventricular hypertrophy ( Sokolow-Lyon , Cornell product , Romhilt-Estes ) ST & T wave abnormality suspected due to LVH No significant change was found compared to prior Confirmed by Roshan Salamon [phone]) on 07/20/2024 11:13:42 AM    CV Studies: Cardiac studies reviewed are outlined and summarized above. Otherwise please see EMR for full report.   Current Reported Medications:.    Active Medications[1]  Physical Exam:    VS:  BP (!) 142/80   Pulse 67   Ht 5' 6 (1.676 m)   Wt 154 lb (69.9 kg)  BMI 24.86 kg/m    Wt Readings from Last 3 Encounters:  07/20/24 154 lb (69.9 kg)  06/27/24 153 lb (69.4 kg)  05/31/24 155 lb (70.3 kg)    GEN: Well nourished, well developed in no acute distress NECK: No JVD; No carotid bruits CARDIAC: RRR, soft SEM, no rubs or gallops RESPIRATORY:  Clear to auscultation without rales, wheezing or rhonchi  ABDOMEN: Soft, non-tender,  non-distended EXTREMITIES:  No edema; No acute deformity. Right radial cath site without hematoma or ecchymosis; good pulse.  Asessement and Plan:.    1. CAD - doing well. Importance of DAPT reinforced; she reports excellent compliance with meds. Continue ASA 81mg  daily, Plavix  75mg  daily, rosuvastatin 20mg  daily. She started rosuvastatin around 12/17 so too early to recheck lipids. I asked her to come fasting to her next OV and we can obtain then. Will get baseline CMET today. She is not interested in cardiac rehab at this time. However, will refer to social worker for transportation options (she is agreeable). She will let us  know if she changes her mind about cardiac rehab.  2. Moderate AI, mild MR - per guidelines, evaluation for AI should be repeated 07/2024; can arrange in later follow-up. Discussed clarification of finding with patient as she was not sure what a leaky valve meant.  3. Moderate LVH - noted on echocardiogram, also with murmur and EKG changes suggestive of LVH. She does have a history of presyncope though this has improved post PCI. I will reach out to Dr. Barbaraann to ensure no further workup needed for this. Could be related to chronic high blood pressure. See below re: HTN. Addendum: Dr. Barbaraann agrees mainstay is BP control, no further testing needed at this time. Can revisit if new symptoms arise.  4. Essential HTN - BP suboptimally controlled, recheck by me 144/80. As above there was some prior confusion about her losartan  dose. Our records had her on 25mg  daily but she reported actually being on 50mg  daily chronically through primary care. Following post-cath instructions, she went back down to 25mg  daily. Getting baseline CMET today for lipid management, will ensure K/Cr stable post cath. Increase losartan  back to 50mg  daily. Recommended return to clinic in 1 month to f/u BP with BP log. Will get f/u CMET/lipids at that time.  5. Presyncope - improved s/p PCI. Follow clinically.  Otherwise as above.    Disposition: F/u in 1 month with APP - does not appear I have availability so will likely be my colleague.  Signed, Ulysses Alper N Shaneequa Bahner, PA-C      [1]  Current Meds  Medication Sig   aspirin  EC 81 MG tablet Take 1 tablet (81 mg total) by mouth daily. Swallow whole.   Calcium  Carbonate Antacid (TUMS PO) Take 1 tablet by mouth daily as needed.   clopidogrel  (PLAVIX ) 75 MG tablet Take 1 tablet (75 mg total) by mouth daily with breakfast.   ferrous sulfate 325 (65 FE) MG tablet Take 325 mg by mouth See admin instructions. Alternates between taking 1 tablet daily and 2 tablets the next day   Glucosamine HCl (GLUCOSAMINE PO) Take 250 mg by mouth daily.   golimumab  (SIMPONI  ARIA) 50 MG/4ML SOLN injection Inject 50 mg into the vein once.   leflunomide (ARAVA) 10 MG tablet Take 10 mg by mouth daily.   losartan  (COZAAR ) 25 MG tablet Take 1 tablet (25 mg total) by mouth daily.   Multiple Vitamins-Minerals (CENTRUM SILVER ULTRA WOMENS) TABS Take 1 tablet by mouth every other  day.   nitroGLYCERIN  (NITROSTAT ) 0.4 MG SL tablet Place 1 tablet (0.4 mg total) under the tongue every 5 (five) minutes as needed for chest pain.   pantoprazole  (PROTONIX ) 40 MG tablet Take 1 tablet (40 mg total) by mouth daily.   Phenazopyridine HCl (AZO URINARY PAIN PO) Take 1 tablet by mouth See admin instructions. Taper dose   Polyvinyl Alcohol-Povidone (REFRESH OP) Place 1 drop into both eyes daily as needed (dry eyes).   rosuvastatin (CRESTOR) 20 MG tablet Take 1 tablet (20 mg total) by mouth daily.   valACYclovir (VALTREX) 1000 MG tablet Take 1,000 mg by mouth daily as needed.   "

## 2024-07-20 ENCOUNTER — Encounter: Payer: Self-pay | Admitting: Physician Assistant

## 2024-07-20 ENCOUNTER — Ambulatory Visit: Attending: Physician Assistant | Admitting: Physician Assistant

## 2024-07-20 ENCOUNTER — Telehealth: Payer: Self-pay | Admitting: Licensed Clinical Social Worker

## 2024-07-20 VITALS — BP 144/80 | HR 67 | Ht 66.0 in | Wt 154.0 lb

## 2024-07-20 DIAGNOSIS — I2510 Atherosclerotic heart disease of native coronary artery without angina pectoris: Secondary | ICD-10-CM | POA: Diagnosis present

## 2024-07-20 DIAGNOSIS — I517 Cardiomegaly: Secondary | ICD-10-CM | POA: Insufficient documentation

## 2024-07-20 DIAGNOSIS — I340 Nonrheumatic mitral (valve) insufficiency: Secondary | ICD-10-CM | POA: Insufficient documentation

## 2024-07-20 DIAGNOSIS — I351 Nonrheumatic aortic (valve) insufficiency: Secondary | ICD-10-CM | POA: Diagnosis present

## 2024-07-20 DIAGNOSIS — I10 Essential (primary) hypertension: Secondary | ICD-10-CM | POA: Diagnosis present

## 2024-07-20 DIAGNOSIS — R55 Syncope and collapse: Secondary | ICD-10-CM | POA: Insufficient documentation

## 2024-07-20 LAB — COMPREHENSIVE METABOLIC PANEL WITH GFR
ALT: 21 IU/L (ref 0–32)
AST: 24 IU/L (ref 0–40)
Albumin: 4.4 g/dL (ref 3.8–4.8)
Alkaline Phosphatase: 151 IU/L — ABNORMAL HIGH (ref 49–135)
BUN/Creatinine Ratio: 12 (ref 12–28)
BUN: 9 mg/dL (ref 8–27)
Bilirubin Total: 0.3 mg/dL (ref 0.0–1.2)
CO2: 26 mmol/L (ref 20–29)
Calcium: 9.4 mg/dL (ref 8.7–10.3)
Chloride: 101 mmol/L (ref 96–106)
Creatinine, Ser: 0.75 mg/dL (ref 0.57–1.00)
Globulin, Total: 3.3 g/dL (ref 1.5–4.5)
Glucose: 69 mg/dL — ABNORMAL LOW (ref 70–99)
Potassium: 4.4 mmol/L (ref 3.5–5.2)
Sodium: 142 mmol/L (ref 134–144)
Total Protein: 7.7 g/dL (ref 6.0–8.5)
eGFR: 82 mL/min/1.73

## 2024-07-20 MED ORDER — LOSARTAN POTASSIUM 50 MG PO TABS: 50.0000 mg | ORAL_TABLET | Freq: Every day | ORAL | 3 refills | Status: DC

## 2024-07-20 NOTE — Telephone Encounter (Signed)
 H&V Care Navigation CSW Progress Note  Clinical Social Worker contacted patient by phone to f/u with pt regarding assistance with transportation/resources. No answer today at [phone]. Left voicemail and will re-attempt again as able.   Patient is participating in a Managed Medicaid Plan: No, Medicare and supplement  SDOH Screenings   Tobacco Use: Medium Risk (07/20/2024)    Marit Lark, MSW, LCSW Clinical Social Worker II [facility] Heart/Vascular Care Navigation  [phone]- work cell phone (preferred)

## 2024-07-20 NOTE — Patient Instructions (Addendum)
 Medication Instructions:  Increase Losartan  50 mg take one tablet daily *If you need a refill on your cardiac medications before your next appointment, please call your pharmacy*  Lab Work: Today- CMET  If you have labs (blood work) drawn today and your tests are completely normal, you will receive your results only by: MyChart Message (if you have MyChart) OR A paper copy in the mail If you have any lab test that is abnormal or we need to change your treatment, we will call you to review the results.   Follow-Up: At [facility], you and your health needs are our priority.  As part of our continuing mission to provide you with exceptional heart care, our providers are all part of one team.  This team includes your primary Cardiologist (physician) and Advanced Practice Providers or APPs (Physician Assistants and Nurse Practitioners) who all work together to provide you with the care you need, when you need it.  Your next appointment:   1 month(s) Please come fasting. (Nothing to eat or drink)  Provider:   One of our Advanced Practice Providers (APPs): Morse Clause, PA-C  Lamarr Satterfield, NP Miriam Shams, NP  Olivia Pavy, PA-C Josefa Beauvais, NP  Leontine Salen, PA-C Orren Fabry, PA-C  [place], PA-C Ernest Dick, NP  Damien Braver, NP Jon Hails, PA-C  Waddell Donath, PA-C    Dayna Dunn, PA-C  Gensel Weaver, PA-C Lum Louis, NP Katlyn West, NP Callie Goodrich, PA-C  Xika Zhao, NP Sheng Haley, PA-C    Kathleen Johnson, PA-C     We recommend signing up for the patient portal called MyChart.  Sign up information is provided on this After Visit Summary.  MyChart is used to connect with patients for Virtual Visits (Telemedicine).  Patients are able to view lab/test results, encounter notes, upcoming appointments, etc.  Non-urgent messages can be sent to your provider as well.   To learn more about what you can do with MyChart, go to [email or web address].   Other  Instructions Please monitor your blood pressure and bring readings to your next appointment.   Referral sent to our Care Navigation Team.

## 2024-07-21 ENCOUNTER — Telehealth: Payer: Self-pay | Admitting: Licensed Clinical Social Worker

## 2024-07-21 ENCOUNTER — Ambulatory Visit: Payer: Self-pay | Admitting: Physician Assistant

## 2024-07-21 DIAGNOSIS — Z79899 Other long term (current) drug therapy: Secondary | ICD-10-CM

## 2024-07-21 NOTE — Progress Notes (Signed)
 " Heart and Vascular Care Navigation  07/21/2024  Karen Delgado [birth year] [id number]  Reason for Referral: transportation concerns   Engaged with patient by telephone for initial visit for Heart and Vascular Care Coordination.                                                                                                   Assessment:                                     LCSW was able to receive call back from pt today [phone]). Introduced self, role, reason for call. Confirmed home address, PCP, full name and DOB. Pt resides alone, denies any issues with any housing, utility or food access. She shares that she usually gets to a from appts with the assistance of friends or church members she worships with. When they take her to appts/around town they make sure that she has access to picking up her medications. She declined cardiac rehab referral as she felt that would be too many rides for her to ask for assistance with. She is okay with me sending her information about [facility] to her home address. She is not sure if that is something that she'd feel comfortable using but is okay with me sending that to her home address for review.   No additional questions or concerns noted at this time, appreciative of call and resources.   HRT/VAS Care Coordination     Patients Home Cardiology Office [facility]   Outpatient Care Team Social Worker   Social Worker Name: Marit Lark, [place], [phone]   Living arrangements for the past 2 months Single Family Home   Lives with: Self   Patient Current Insurance Coverage Traditional Medicare  BCBS supplement   Patient Has Concern With Paying Medical Bills No   Does Patient Have Prescription Coverage? Yes   Home Assistive Devices/Equipment None       Social History:                                                                             SDOH Screenings   Food Insecurity: No Food Insecurity (07/21/2024)  Housing: Not At Risk (07/21/2024)   Transportation Needs: No Transportation Needs (07/21/2024)  Utilities: Not At Risk (07/21/2024)  Financial Resource Strain: Low Risk (07/21/2024)  Tobacco Use: Medium Risk (07/20/2024)  Health Literacy: Adequate Health Literacy (07/21/2024)    SDOH Interventions: Financial Resources:  Financial Strain Interventions: Intervention Not Indicated  Food Insecurity:  Food Insecurity Interventions: Intervention Not Indicated  Housing Insecurity:  Housing Interventions: Intervention Not Indicated  Transportation:   Transportation Interventions: Community Resources Provided, [facility]) (sent assistance resources for transportation and [employer] application and  information; pt shares she usually uses family/friends/fellow church congregants to get to and from appts/around town)    Other Care Navigation Interventions:     Provided Pharmacy assistance resources  Pt denies any issues obtaining or affording medications- she shares she usually picks them up on the way to church  Patient expressed Mental Health concerns No.   Follow-up plan:   LCSW sent pt the following resources: my card, transportation resource list and information about [employer] and an application should pt be interested in that resource. Unfortunately, we are not able to pay for transportation for number of cardiac rehab visits recommended by program. Will f/u to ensure she received packet and answer any additional questions that may arise.      "

## 2024-07-27 NOTE — Telephone Encounter (Signed)
 Hi triage - please have patient go up further on her losartan  to 100mg  daily and recheck BMET in 1-2 week. The social worker tried to call her about transportation issues, can let her know to check her voicemail about that. I suspect she will need another agent added to her blood pressure regimen to help control this but we will take it one step at a time. I'd like her to submit a log of her blood pressures when she returns for the labs - if possible would be great if she could include the heart rate. She does not need to be fasting for labs.  Self note: would consider amlodipine or carvedilol for next steps contingent on BP/HR trends. Deferring thiazide/MRA because she hates to get labwork.

## 2024-08-01 MED ORDER — LOSARTAN POTASSIUM 100 MG PO TABS: 100.0000 mg | ORAL_TABLET | Freq: Every day | ORAL | 3 refills | Status: AC

## 2024-08-01 NOTE — Telephone Encounter (Signed)
 Patient notified, lab order placed, medication list updated. The patient stated  that she received some paperwork from the social worker and that she will return the social worker's call today.

## 2024-08-02 ENCOUNTER — Telehealth: Payer: Self-pay | Admitting: Licensed Clinical Social Worker

## 2024-08-02 NOTE — Telephone Encounter (Signed)
 H&V Care Navigation CSW Progress Note  Clinical Social Worker contacted patient by phone to f/u on voicemail received regarding transportation resources. Reached pt at [phone]. She confirmed receiving packet. Had called insurance company regarding benefits, none through her plans. Explained I had sent [employer] application (previously called [employer]) to her home address. She has that and will complete/bring to PCP. Explained process of application and encouraged her to call me as needed moving forward. No additional questions at this time.  Patient is participating in a Managed Medicaid Plan:  No, traditional medicare and BCBS supplement  SDOH Screenings   Food Insecurity: No Food Insecurity (07/21/2024)  Housing: Not At Risk (07/21/2024)  Transportation Needs: No Transportation Needs (07/21/2024)  Utilities: Not At Risk (07/21/2024)  Financial Resource Strain: Low Risk (07/21/2024)  Tobacco Use: Medium Risk (07/20/2024)  Health Literacy: Adequate Health Literacy (07/21/2024)    Marit Lark, MSW, LCSW Clinical Social Worker II [facility] Heart/Vascular Care Navigation  [phone]- work cell phone (preferred)

## 2024-09-04 ENCOUNTER — Ambulatory Visit: Admitting: Cardiology

## 2024-09-05 ENCOUNTER — Ambulatory Visit: Admitting: Cardiology

## 2024-10-02 ENCOUNTER — Ambulatory Visit: Admitting: Pulmonary Disease
